# Patient Record
Sex: Female | Born: 1965 | Race: White | Hispanic: No | Marital: Married
Health system: Southern US, Community
[De-identification: ages and names within clinical notes are randomized; demographics above are authoritative.]

---

## 1997-11-15 ENCOUNTER — Other Ambulatory Visit: Admission: RE | Admit: 1997-11-15 | Discharge: 1997-11-15 | Payer: Self-pay | Admitting: Obstetrics & Gynecology

## 1997-12-28 ENCOUNTER — Ambulatory Visit (HOSPITAL_COMMUNITY): Admission: RE | Admit: 1997-12-28 | Discharge: 1997-12-28 | Payer: Self-pay | Admitting: Obstetrics & Gynecology

## 1997-12-28 ENCOUNTER — Other Ambulatory Visit: Admission: RE | Admit: 1997-12-28 | Discharge: 1997-12-28 | Payer: Self-pay | Admitting: Obstetrics & Gynecology

## 1998-03-15 ENCOUNTER — Other Ambulatory Visit: Admission: RE | Admit: 1998-03-15 | Discharge: 1998-03-15 | Payer: Self-pay | Admitting: Obstetrics & Gynecology

## 1998-06-12 ENCOUNTER — Inpatient Hospital Stay (HOSPITAL_COMMUNITY): Admission: AD | Admit: 1998-06-12 | Discharge: 1998-06-14 | Payer: Self-pay | Admitting: Obstetrics and Gynecology

## 1998-06-17 ENCOUNTER — Inpatient Hospital Stay (HOSPITAL_COMMUNITY): Admission: AD | Admit: 1998-06-17 | Discharge: 1998-06-17 | Payer: Self-pay | Admitting: Obstetrics and Gynecology

## 1998-06-21 ENCOUNTER — Encounter (HOSPITAL_COMMUNITY): Admission: RE | Admit: 1998-06-21 | Discharge: 1998-08-03 | Payer: Self-pay | Admitting: *Deleted

## 1999-10-09 ENCOUNTER — Other Ambulatory Visit: Admission: RE | Admit: 1999-10-09 | Discharge: 1999-10-09 | Payer: Self-pay | Admitting: Obstetrics and Gynecology

## 2000-12-02 ENCOUNTER — Other Ambulatory Visit: Admission: RE | Admit: 2000-12-02 | Discharge: 2000-12-02 | Payer: Self-pay | Admitting: Obstetrics and Gynecology

## 2002-12-06 ENCOUNTER — Other Ambulatory Visit: Admission: RE | Admit: 2002-12-06 | Discharge: 2002-12-06 | Payer: Self-pay | Admitting: Obstetrics and Gynecology

## 2003-12-18 ENCOUNTER — Other Ambulatory Visit: Admission: RE | Admit: 2003-12-18 | Discharge: 2003-12-18 | Payer: Self-pay | Admitting: Obstetrics and Gynecology

## 2005-01-29 ENCOUNTER — Other Ambulatory Visit: Admission: RE | Admit: 2005-01-29 | Discharge: 2005-01-29 | Payer: Self-pay | Admitting: Obstetrics and Gynecology

## 2005-02-12 ENCOUNTER — Ambulatory Visit (HOSPITAL_COMMUNITY): Admission: RE | Admit: 2005-02-12 | Discharge: 2005-02-12 | Payer: Self-pay | Admitting: Obstetrics and Gynecology

## 2005-02-14 ENCOUNTER — Encounter: Admission: RE | Admit: 2005-02-14 | Discharge: 2005-02-14 | Payer: Self-pay | Admitting: Obstetrics and Gynecology

## 2005-08-06 ENCOUNTER — Encounter: Admission: RE | Admit: 2005-08-06 | Discharge: 2005-08-06 | Payer: Self-pay | Admitting: Obstetrics and Gynecology

## 2005-11-10 ENCOUNTER — Other Ambulatory Visit: Admission: RE | Admit: 2005-11-10 | Discharge: 2005-11-10 | Payer: Self-pay | Admitting: Obstetrics and Gynecology

## 2006-01-26 ENCOUNTER — Encounter: Admission: RE | Admit: 2006-01-26 | Discharge: 2006-01-26 | Payer: Self-pay | Admitting: Obstetrics and Gynecology

## 2006-01-26 ENCOUNTER — Encounter (INDEPENDENT_AMBULATORY_CARE_PROVIDER_SITE_OTHER): Payer: Self-pay | Admitting: *Deleted

## 2006-02-20 ENCOUNTER — Ambulatory Visit (HOSPITAL_BASED_OUTPATIENT_CLINIC_OR_DEPARTMENT_OTHER): Admission: RE | Admit: 2006-02-20 | Discharge: 2006-02-20 | Payer: Self-pay | Admitting: General Surgery

## 2006-02-20 ENCOUNTER — Encounter: Admission: RE | Admit: 2006-02-20 | Discharge: 2006-02-20 | Payer: Self-pay | Admitting: General Surgery

## 2006-02-20 ENCOUNTER — Encounter (INDEPENDENT_AMBULATORY_CARE_PROVIDER_SITE_OTHER): Payer: Self-pay | Admitting: Specialist

## 2007-04-01 ENCOUNTER — Encounter: Admission: RE | Admit: 2007-04-01 | Discharge: 2007-04-01 | Payer: Self-pay | Admitting: Obstetrics and Gynecology

## 2007-08-26 HISTORY — PX: BREAST EXCISIONAL BIOPSY: SUR124

## 2008-05-08 ENCOUNTER — Encounter: Admission: RE | Admit: 2008-05-08 | Discharge: 2008-05-08 | Payer: Self-pay | Admitting: Obstetrics and Gynecology

## 2010-09-15 ENCOUNTER — Encounter: Payer: Self-pay | Admitting: Obstetrics and Gynecology

## 2010-09-15 ENCOUNTER — Encounter: Payer: Self-pay | Admitting: General Surgery

## 2011-01-10 NOTE — Op Note (Signed)
NAMEKENNEDIE, PARDOE                    ACCOUNT NO.:  [id number]   MEDICAL RECORD NO.:  [id number]          PATIENT TYPE:  AMB   LOCATION:  DSC                          FACILITY:  [facility]   PHYSICIAN:  Rose Phi. Maple Hudson, M.D.   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   OPERATION:  Excision of  left breast mass with needle localization and  specimen mammogram.   SURGEON:  Rose Phi. Maple Hudson, M.D.   ANESTHESIA:  MAC.   OPERATIVE PROCEDURE:  Prior to coming to the operating room, a localizing  wire had been placed in the medial portion of her left breast to identify  the nonpalpable lump.   The patient was placed on the operating table with the arms extended on the  arm board and the left breast prepped and draped in the usual fashion.  A  short transverse incision was then outlined at the 9 o'clock position of the  left breast using the previously placed wire as a guide.  The area was  thoroughly infiltrated with a local anesthetic mixture.  An incision was  made and with a little traction on the wire, after delivering it into the  incision, we simply excised the wire and surrounding tissue.  Specimen  mammogram confirmed the removal of the lesion.  Hemostasis obtained with the  cautery.  The deeper tissue was approximated with 3-0 Vicryl without  distorting the breast and the subcuticular closure with 4-0 Monocryl and  Steri-Strips carried out.  Dressing applied.  The patient transferred to the  recovery room in satisfactory condition having tolerated the procedure well.      Rose Phi. Maple Hudson, M.D.  Electronically Signed     PRY/MEDQ  D:  02/20/2006  T:  02/20/2006  Job:  [id number]

## 2012-02-24 ENCOUNTER — Other Ambulatory Visit: Payer: Self-pay | Admitting: Obstetrics and Gynecology

## 2012-02-24 DIAGNOSIS — Z1231 Encounter for screening mammogram for malignant neoplasm of breast: Secondary | ICD-10-CM

## 2012-03-01 ENCOUNTER — Ambulatory Visit
Admission: RE | Admit: 2012-03-01 | Discharge: 2012-03-01 | Disposition: A | Discharge status: Home / Self Care | Payer: BC Managed Care – PPO | Source: Ambulatory Visit | Attending: Obstetrics and Gynecology | Admitting: Obstetrics and Gynecology

## 2012-03-01 DIAGNOSIS — Z1231 Encounter for screening mammogram for malignant neoplasm of breast: Secondary | ICD-10-CM

## 2012-03-03 ENCOUNTER — Other Ambulatory Visit: Payer: Self-pay | Admitting: Obstetrics and Gynecology

## 2012-03-03 DIAGNOSIS — R928 Other abnormal and inconclusive findings on diagnostic imaging of breast: Secondary | ICD-10-CM

## 2012-03-05 ENCOUNTER — Other Ambulatory Visit: Payer: BC Managed Care – PPO

## 2012-03-05 ENCOUNTER — Ambulatory Visit
Admission: RE | Admit: 2012-03-05 | Discharge: 2012-03-05 | Disposition: A | Discharge status: Home / Self Care | Payer: BC Managed Care – PPO | Source: Ambulatory Visit | Attending: Obstetrics and Gynecology | Admitting: Obstetrics and Gynecology

## 2012-03-05 DIAGNOSIS — R928 Other abnormal and inconclusive findings on diagnostic imaging of breast: Secondary | ICD-10-CM

## 2012-03-18 ENCOUNTER — Telehealth: Payer: Self-pay

## 2012-03-18 NOTE — Telephone Encounter (Signed)
Pt was called to confirm further F/U following mammogram results.  Pt stated that she did have an ultrasound and findings were dense breast tissue"left". Pt was told to F/U in 6 months. . Me

## 2013-02-02 ENCOUNTER — Other Ambulatory Visit: Payer: Self-pay | Admitting: Obstetrics and Gynecology

## 2013-02-02 DIAGNOSIS — R922 Inconclusive mammogram: Secondary | ICD-10-CM

## 2013-03-09 ENCOUNTER — Ambulatory Visit
Admission: RE | Admit: 2013-03-09 | Discharge: 2013-03-09 | Disposition: A | Discharge status: Home / Self Care | Payer: BC Managed Care – PPO | Source: Ambulatory Visit | Attending: Obstetrics and Gynecology | Admitting: Obstetrics and Gynecology

## 2013-03-09 DIAGNOSIS — R922 Inconclusive mammogram: Secondary | ICD-10-CM

## 2013-03-09 DIAGNOSIS — R9230 Dense breasts, unspecified: Secondary | ICD-10-CM

## 2014-03-03 ENCOUNTER — Other Ambulatory Visit: Payer: Self-pay | Admitting: Obstetrics and Gynecology

## 2014-03-03 DIAGNOSIS — R922 Inconclusive mammogram: Secondary | ICD-10-CM

## 2014-03-10 ENCOUNTER — Ambulatory Visit
Admission: RE | Admit: 2014-03-10 | Discharge: 2014-03-10 | Disposition: A | Discharge status: Home / Self Care | Payer: BC Managed Care – PPO | Source: Ambulatory Visit | Attending: Obstetrics and Gynecology | Admitting: Obstetrics and Gynecology

## 2014-03-10 DIAGNOSIS — R922 Inconclusive mammogram: Secondary | ICD-10-CM

## 2015-12-10 DIAGNOSIS — Z01419 Encounter for gynecological examination (general) (routine) without abnormal findings: Secondary | ICD-10-CM | POA: Diagnosis not present

## 2015-12-10 DIAGNOSIS — N951 Menopausal and female climacteric states: Secondary | ICD-10-CM | POA: Diagnosis not present

## 2015-12-10 DIAGNOSIS — Z1231 Encounter for screening mammogram for malignant neoplasm of breast: Secondary | ICD-10-CM | POA: Diagnosis not present

## 2015-12-10 DIAGNOSIS — Z6826 Body mass index (BMI) 26.0-26.9, adult: Secondary | ICD-10-CM | POA: Diagnosis not present

## 2015-12-10 DIAGNOSIS — Z124 Encounter for screening for malignant neoplasm of cervix: Secondary | ICD-10-CM | POA: Diagnosis not present

## 2015-12-19 DIAGNOSIS — M25512 Pain in left shoulder: Secondary | ICD-10-CM | POA: Diagnosis not present

## 2015-12-25 DIAGNOSIS — M25512 Pain in left shoulder: Secondary | ICD-10-CM | POA: Diagnosis not present

## 2016-02-19 DIAGNOSIS — G4700 Insomnia, unspecified: Secondary | ICD-10-CM | POA: Diagnosis not present

## 2016-02-19 DIAGNOSIS — F419 Anxiety disorder, unspecified: Secondary | ICD-10-CM | POA: Diagnosis not present

## 2016-02-19 DIAGNOSIS — F900 Attention-deficit hyperactivity disorder, predominantly inattentive type: Secondary | ICD-10-CM | POA: Diagnosis not present

## 2016-02-19 DIAGNOSIS — Z0000 Encounter for general adult medical examination without abnormal findings: Secondary | ICD-10-CM | POA: Diagnosis not present

## 2016-03-03 DIAGNOSIS — H5203 Hypermetropia, bilateral: Secondary | ICD-10-CM | POA: Diagnosis not present

## 2016-03-20 DIAGNOSIS — Z0000 Encounter for general adult medical examination without abnormal findings: Secondary | ICD-10-CM | POA: Diagnosis not present

## 2016-03-20 DIAGNOSIS — Z13220 Encounter for screening for lipoid disorders: Secondary | ICD-10-CM | POA: Diagnosis not present

## 2016-03-26 DIAGNOSIS — F419 Anxiety disorder, unspecified: Secondary | ICD-10-CM | POA: Diagnosis not present

## 2016-09-14 DIAGNOSIS — R05 Cough: Secondary | ICD-10-CM | POA: Diagnosis not present

## 2016-09-14 DIAGNOSIS — B349 Viral infection, unspecified: Secondary | ICD-10-CM | POA: Diagnosis not present

## 2016-09-25 DIAGNOSIS — G4700 Insomnia, unspecified: Secondary | ICD-10-CM | POA: Diagnosis not present

## 2016-09-25 DIAGNOSIS — F900 Attention-deficit hyperactivity disorder, predominantly inattentive type: Secondary | ICD-10-CM | POA: Diagnosis not present

## 2016-09-25 DIAGNOSIS — F419 Anxiety disorder, unspecified: Secondary | ICD-10-CM | POA: Diagnosis not present

## 2016-11-08 DIAGNOSIS — J069 Acute upper respiratory infection, unspecified: Secondary | ICD-10-CM | POA: Diagnosis not present

## 2017-02-10 DIAGNOSIS — M25552 Pain in left hip: Secondary | ICD-10-CM | POA: Diagnosis not present

## 2017-02-10 DIAGNOSIS — G8929 Other chronic pain: Secondary | ICD-10-CM | POA: Diagnosis not present

## 2017-02-10 DIAGNOSIS — M1612 Unilateral primary osteoarthritis, left hip: Secondary | ICD-10-CM | POA: Diagnosis not present

## 2017-02-10 DIAGNOSIS — M160 Bilateral primary osteoarthritis of hip: Secondary | ICD-10-CM | POA: Diagnosis not present

## 2017-02-19 DIAGNOSIS — Z0000 Encounter for general adult medical examination without abnormal findings: Secondary | ICD-10-CM | POA: Diagnosis not present

## 2017-02-19 DIAGNOSIS — Z13220 Encounter for screening for lipoid disorders: Secondary | ICD-10-CM | POA: Diagnosis not present

## 2017-02-27 DIAGNOSIS — M25552 Pain in left hip: Secondary | ICD-10-CM | POA: Diagnosis not present

## 2017-03-13 DIAGNOSIS — G8929 Other chronic pain: Secondary | ICD-10-CM | POA: Diagnosis not present

## 2017-03-13 DIAGNOSIS — M25552 Pain in left hip: Secondary | ICD-10-CM | POA: Diagnosis not present

## 2017-03-13 DIAGNOSIS — M545 Low back pain: Secondary | ICD-10-CM | POA: Diagnosis not present

## 2017-03-20 DIAGNOSIS — G8929 Other chronic pain: Secondary | ICD-10-CM | POA: Diagnosis not present

## 2017-03-20 DIAGNOSIS — M545 Low back pain: Secondary | ICD-10-CM | POA: Diagnosis not present

## 2017-03-20 DIAGNOSIS — M25552 Pain in left hip: Secondary | ICD-10-CM | POA: Diagnosis not present

## 2017-03-25 DIAGNOSIS — F900 Attention-deficit hyperactivity disorder, predominantly inattentive type: Secondary | ICD-10-CM | POA: Diagnosis not present

## 2017-03-25 DIAGNOSIS — F419 Anxiety disorder, unspecified: Secondary | ICD-10-CM | POA: Diagnosis not present

## 2017-03-25 DIAGNOSIS — G4700 Insomnia, unspecified: Secondary | ICD-10-CM | POA: Diagnosis not present

## 2017-03-25 DIAGNOSIS — Z1211 Encounter for screening for malignant neoplasm of colon: Secondary | ICD-10-CM | POA: Diagnosis not present

## 2017-03-27 DIAGNOSIS — G8929 Other chronic pain: Secondary | ICD-10-CM | POA: Diagnosis not present

## 2017-03-27 DIAGNOSIS — M545 Low back pain: Secondary | ICD-10-CM | POA: Diagnosis not present

## 2017-03-27 DIAGNOSIS — M25552 Pain in left hip: Secondary | ICD-10-CM | POA: Diagnosis not present

## 2017-04-01 DIAGNOSIS — M25552 Pain in left hip: Secondary | ICD-10-CM | POA: Diagnosis not present

## 2017-04-01 DIAGNOSIS — M47898 Other spondylosis, sacral and sacrococcygeal region: Secondary | ICD-10-CM | POA: Diagnosis not present

## 2017-04-01 DIAGNOSIS — M5416 Radiculopathy, lumbar region: Secondary | ICD-10-CM | POA: Diagnosis not present

## 2017-04-01 DIAGNOSIS — M4317 Spondylolisthesis, lumbosacral region: Secondary | ICD-10-CM | POA: Diagnosis not present

## 2017-04-02 DIAGNOSIS — G8929 Other chronic pain: Secondary | ICD-10-CM | POA: Diagnosis not present

## 2017-04-02 DIAGNOSIS — M545 Low back pain: Secondary | ICD-10-CM | POA: Diagnosis not present

## 2017-04-02 DIAGNOSIS — M25552 Pain in left hip: Secondary | ICD-10-CM | POA: Diagnosis not present

## 2017-04-06 ENCOUNTER — Other Ambulatory Visit: Payer: Self-pay | Admitting: Orthopedic Surgery

## 2017-04-06 DIAGNOSIS — M545 Low back pain, unspecified: Secondary | ICD-10-CM

## 2017-04-06 DIAGNOSIS — M5416 Radiculopathy, lumbar region: Secondary | ICD-10-CM

## 2017-04-13 ENCOUNTER — Ambulatory Visit
Admission: RE | Admit: 2017-04-13 | Discharge: 2017-04-13 | Disposition: A | Discharge status: Home / Self Care | Payer: BLUE CROSS/BLUE SHIELD | Source: Ambulatory Visit | Attending: Orthopedic Surgery | Admitting: Orthopedic Surgery

## 2017-04-13 DIAGNOSIS — M5126 Other intervertebral disc displacement, lumbar region: Secondary | ICD-10-CM | POA: Diagnosis not present

## 2017-04-13 DIAGNOSIS — M545 Low back pain, unspecified: Secondary | ICD-10-CM

## 2017-04-13 DIAGNOSIS — M5416 Radiculopathy, lumbar region: Secondary | ICD-10-CM

## 2017-07-23 DIAGNOSIS — H5203 Hypermetropia, bilateral: Secondary | ICD-10-CM | POA: Diagnosis not present

## 2017-10-01 DIAGNOSIS — F419 Anxiety disorder, unspecified: Secondary | ICD-10-CM | POA: Diagnosis not present

## 2017-10-01 DIAGNOSIS — G4700 Insomnia, unspecified: Secondary | ICD-10-CM | POA: Diagnosis not present

## 2017-10-01 DIAGNOSIS — F900 Attention-deficit hyperactivity disorder, predominantly inattentive type: Secondary | ICD-10-CM | POA: Diagnosis not present

## 2018-02-26 ENCOUNTER — Other Ambulatory Visit: Payer: Self-pay | Admitting: Family Medicine

## 2018-02-26 DIAGNOSIS — Z1231 Encounter for screening mammogram for malignant neoplasm of breast: Secondary | ICD-10-CM

## 2018-03-07 DIAGNOSIS — F419 Anxiety disorder, unspecified: Secondary | ICD-10-CM | POA: Diagnosis not present

## 2018-03-07 DIAGNOSIS — H532 Diplopia: Secondary | ICD-10-CM | POA: Diagnosis not present

## 2018-03-07 DIAGNOSIS — R030 Elevated blood-pressure reading, without diagnosis of hypertension: Secondary | ICD-10-CM | POA: Diagnosis not present

## 2018-03-24 DIAGNOSIS — M7062 Trochanteric bursitis, left hip: Secondary | ICD-10-CM | POA: Diagnosis not present

## 2018-03-24 DIAGNOSIS — M5116 Intervertebral disc disorders with radiculopathy, lumbar region: Secondary | ICD-10-CM | POA: Diagnosis not present

## 2018-03-24 DIAGNOSIS — M1612 Unilateral primary osteoarthritis, left hip: Secondary | ICD-10-CM | POA: Diagnosis not present

## 2018-03-25 ENCOUNTER — Ambulatory Visit
Admission: RE | Admit: 2018-03-25 | Discharge: 2018-03-25 | Disposition: A | Discharge status: Home / Self Care | Payer: BLUE CROSS/BLUE SHIELD | Source: Ambulatory Visit | Attending: Family Medicine | Admitting: Family Medicine

## 2018-03-25 DIAGNOSIS — Z1231 Encounter for screening mammogram for malignant neoplasm of breast: Secondary | ICD-10-CM | POA: Diagnosis not present

## 2018-03-26 ENCOUNTER — Other Ambulatory Visit: Payer: Self-pay | Admitting: Family Medicine

## 2018-03-26 DIAGNOSIS — R928 Other abnormal and inconclusive findings on diagnostic imaging of breast: Secondary | ICD-10-CM

## 2018-03-30 DIAGNOSIS — Z23 Encounter for immunization: Secondary | ICD-10-CM | POA: Diagnosis not present

## 2018-03-30 DIAGNOSIS — Z6826 Body mass index (BMI) 26.0-26.9, adult: Secondary | ICD-10-CM | POA: Diagnosis not present

## 2018-03-30 DIAGNOSIS — Z13220 Encounter for screening for lipoid disorders: Secondary | ICD-10-CM | POA: Diagnosis not present

## 2018-03-30 DIAGNOSIS — F419 Anxiety disorder, unspecified: Secondary | ICD-10-CM | POA: Diagnosis not present

## 2018-03-30 DIAGNOSIS — F900 Attention-deficit hyperactivity disorder, predominantly inattentive type: Secondary | ICD-10-CM | POA: Diagnosis not present

## 2018-03-30 DIAGNOSIS — Z0000 Encounter for general adult medical examination without abnormal findings: Secondary | ICD-10-CM | POA: Diagnosis not present

## 2018-03-30 DIAGNOSIS — G4700 Insomnia, unspecified: Secondary | ICD-10-CM | POA: Diagnosis not present

## 2018-03-31 ENCOUNTER — Ambulatory Visit
Admission: RE | Admit: 2018-03-31 | Discharge: 2018-03-31 | Disposition: A | Discharge status: Home / Self Care | Payer: BLUE CROSS/BLUE SHIELD | Source: Ambulatory Visit | Attending: Family Medicine | Admitting: Family Medicine

## 2018-03-31 ENCOUNTER — Other Ambulatory Visit: Payer: Self-pay | Admitting: Family Medicine

## 2018-03-31 DIAGNOSIS — R928 Other abnormal and inconclusive findings on diagnostic imaging of breast: Secondary | ICD-10-CM

## 2018-03-31 DIAGNOSIS — N6310 Unspecified lump in the right breast, unspecified quadrant: Secondary | ICD-10-CM

## 2018-03-31 DIAGNOSIS — N6489 Other specified disorders of breast: Secondary | ICD-10-CM | POA: Diagnosis not present

## 2018-10-07 ENCOUNTER — Other Ambulatory Visit: Payer: BLUE CROSS/BLUE SHIELD

## 2018-10-07 DIAGNOSIS — Z23 Encounter for immunization: Secondary | ICD-10-CM | POA: Diagnosis not present

## 2018-10-07 DIAGNOSIS — N951 Menopausal and female climacteric states: Secondary | ICD-10-CM | POA: Diagnosis not present

## 2018-10-07 DIAGNOSIS — F900 Attention-deficit hyperactivity disorder, predominantly inattentive type: Secondary | ICD-10-CM | POA: Diagnosis not present

## 2018-10-07 DIAGNOSIS — G4700 Insomnia, unspecified: Secondary | ICD-10-CM | POA: Diagnosis not present

## 2018-10-07 DIAGNOSIS — F419 Anxiety disorder, unspecified: Secondary | ICD-10-CM | POA: Diagnosis not present

## 2018-10-11 DIAGNOSIS — S76012D Strain of muscle, fascia and tendon of left hip, subsequent encounter: Secondary | ICD-10-CM | POA: Diagnosis not present

## 2018-10-11 DIAGNOSIS — M25552 Pain in left hip: Secondary | ICD-10-CM | POA: Diagnosis not present

## 2018-10-23 DIAGNOSIS — S76012D Strain of muscle, fascia and tendon of left hip, subsequent encounter: Secondary | ICD-10-CM | POA: Diagnosis not present

## 2018-10-23 DIAGNOSIS — M25552 Pain in left hip: Secondary | ICD-10-CM | POA: Diagnosis not present

## 2018-10-30 DIAGNOSIS — M25552 Pain in left hip: Secondary | ICD-10-CM | POA: Diagnosis not present

## 2018-10-30 DIAGNOSIS — S76012D Strain of muscle, fascia and tendon of left hip, subsequent encounter: Secondary | ICD-10-CM | POA: Diagnosis not present

## 2018-11-29 IMAGING — MG DIGITAL SCREENING BILATERAL MAMMOGRAM WITH TOMO AND CAD
8 series · 8 of 24 positions shown · non-contrast
Comparison: Previous exam(s).

CLINICAL DATA: Screening.

EXAM:
DIGITAL SCREENING BILATERAL MAMMOGRAM WITH TOMO AND CAD

[R CC synth-2D]
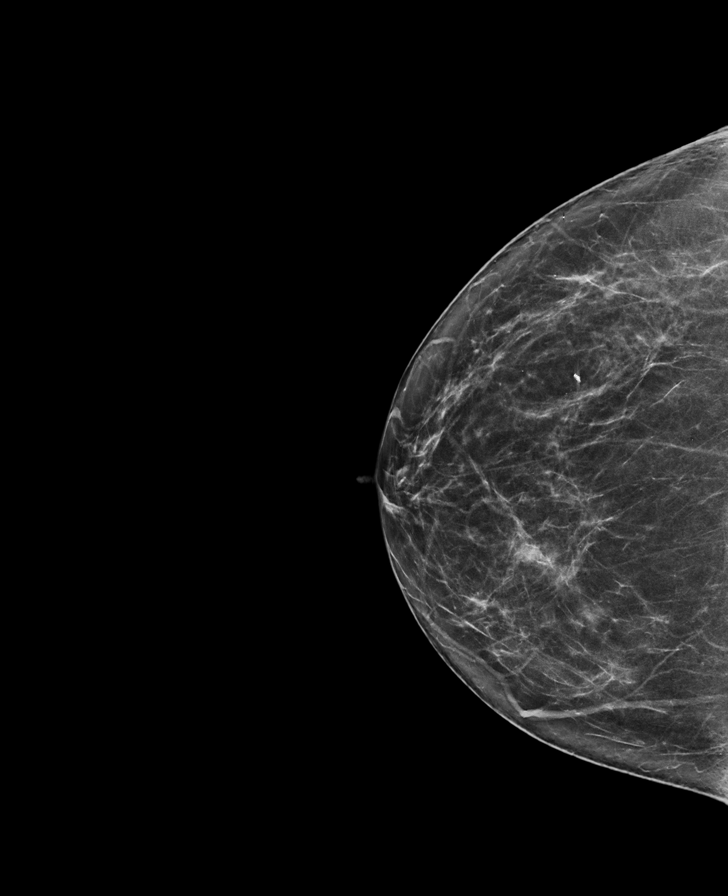

[R MLO synth-2D]
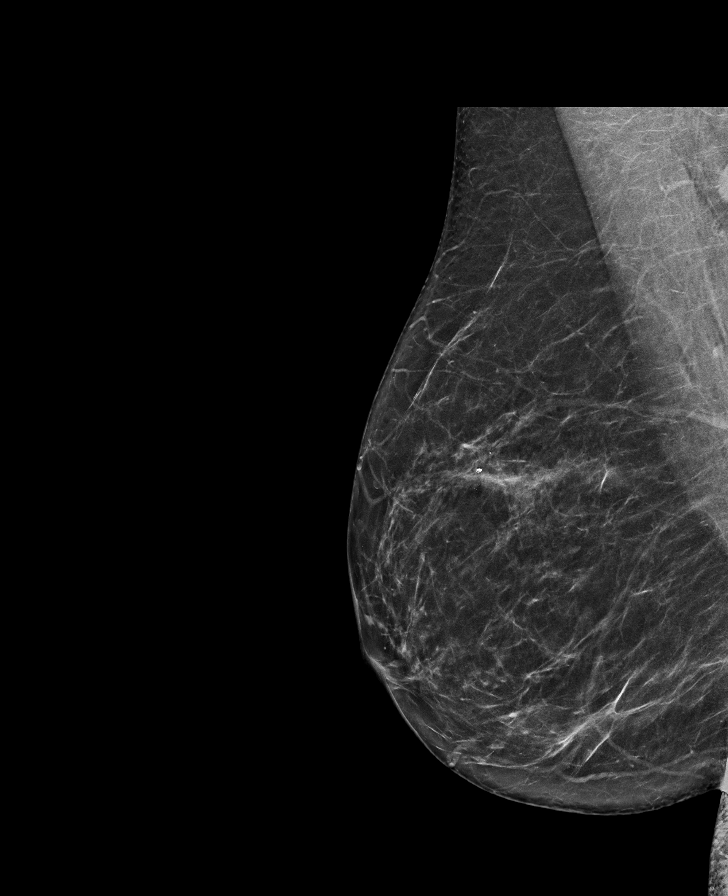

[L MLO synth-2D]
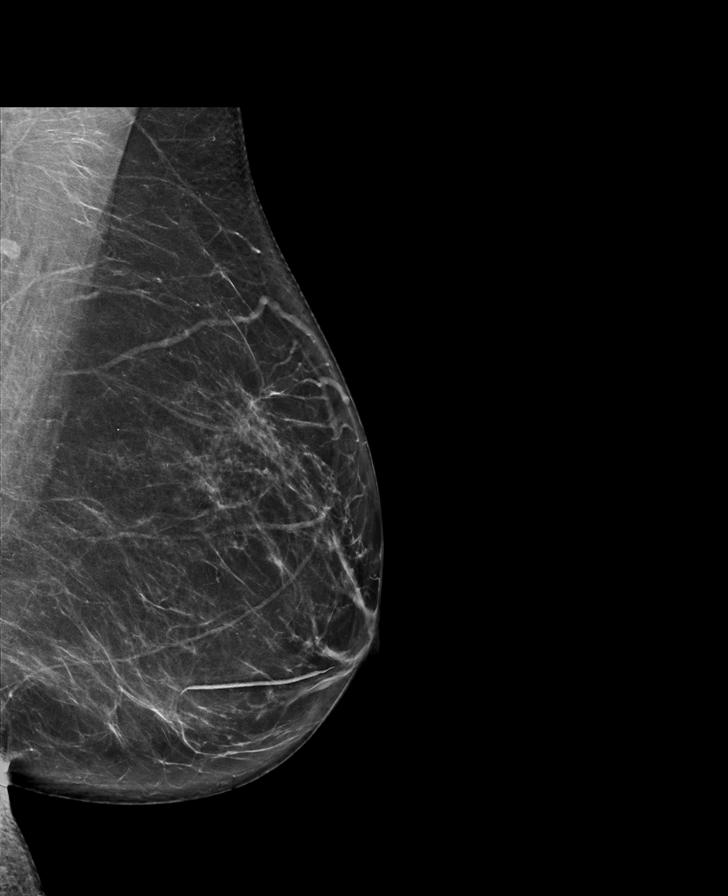

[L CC synth-2D]
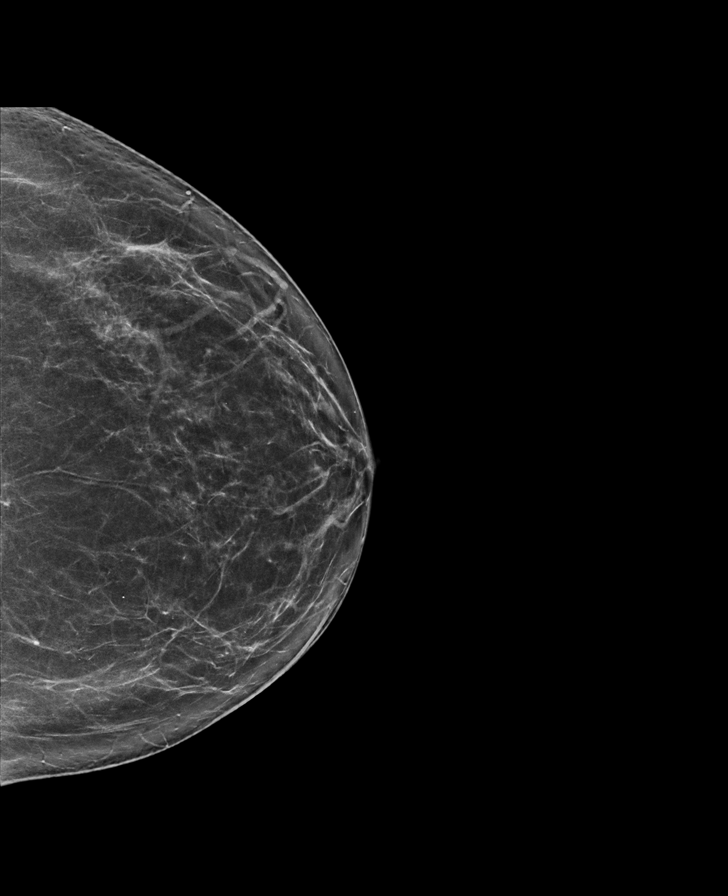

[L MLO tomo · tomo slice 40/79.0]
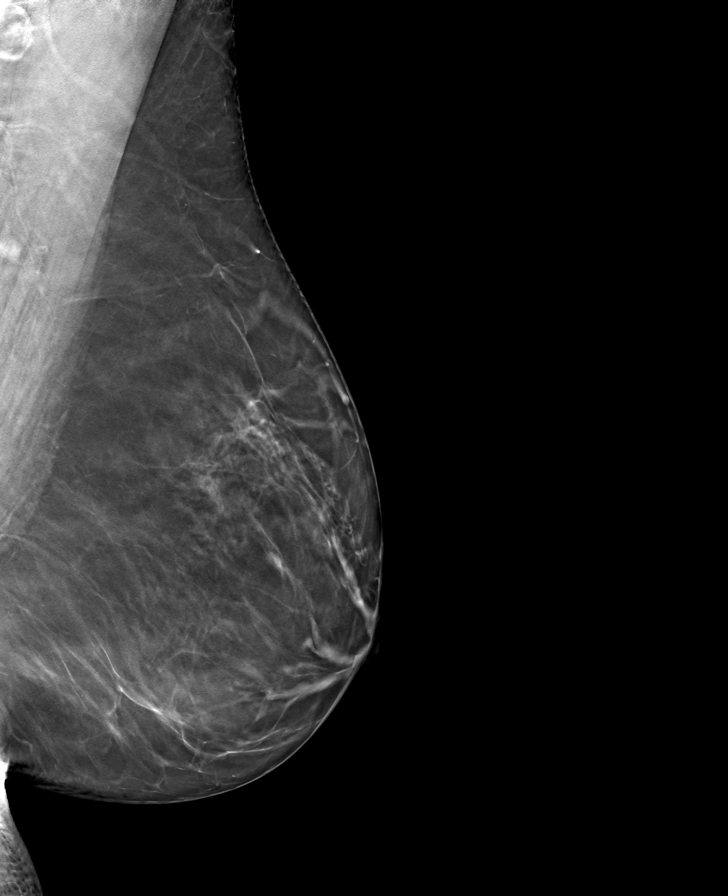

[L CC tomo · tomo slice 37/73.0]
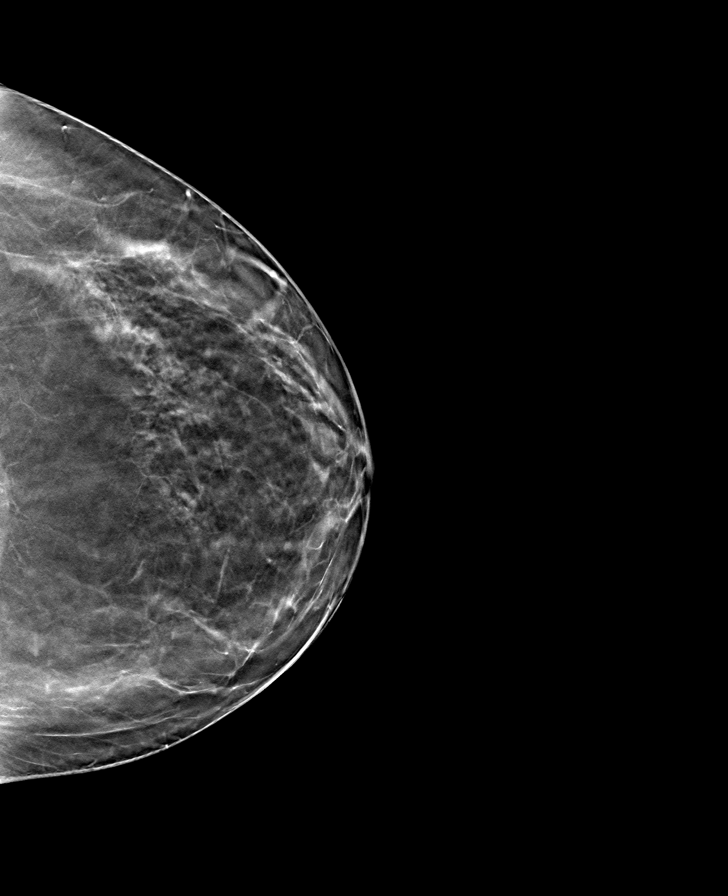

[R MLO tomo · tomo slice 37/72.0]
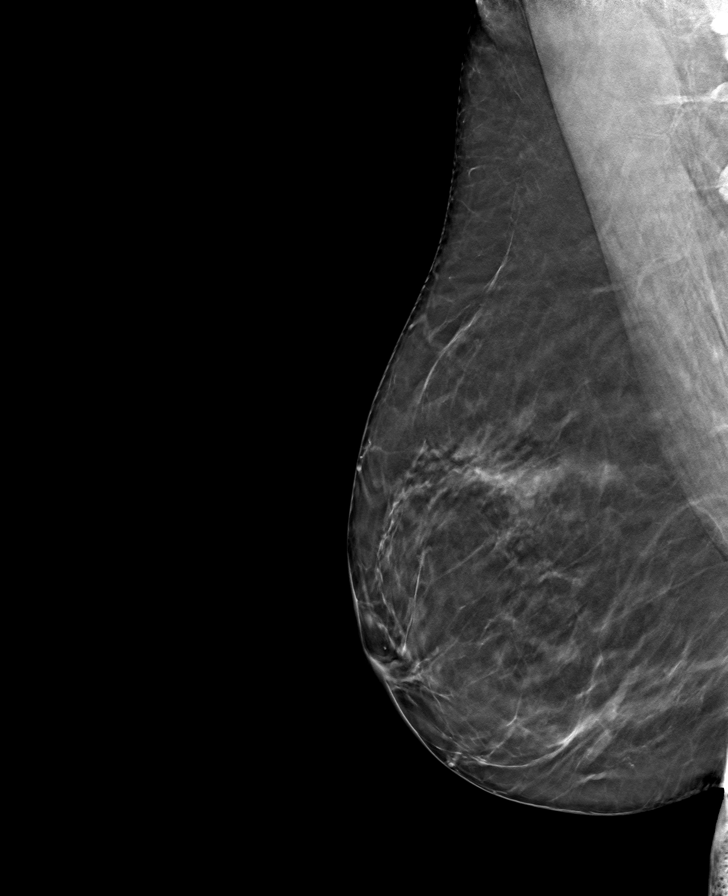

[R CC tomo · tomo slice 37/73.0]
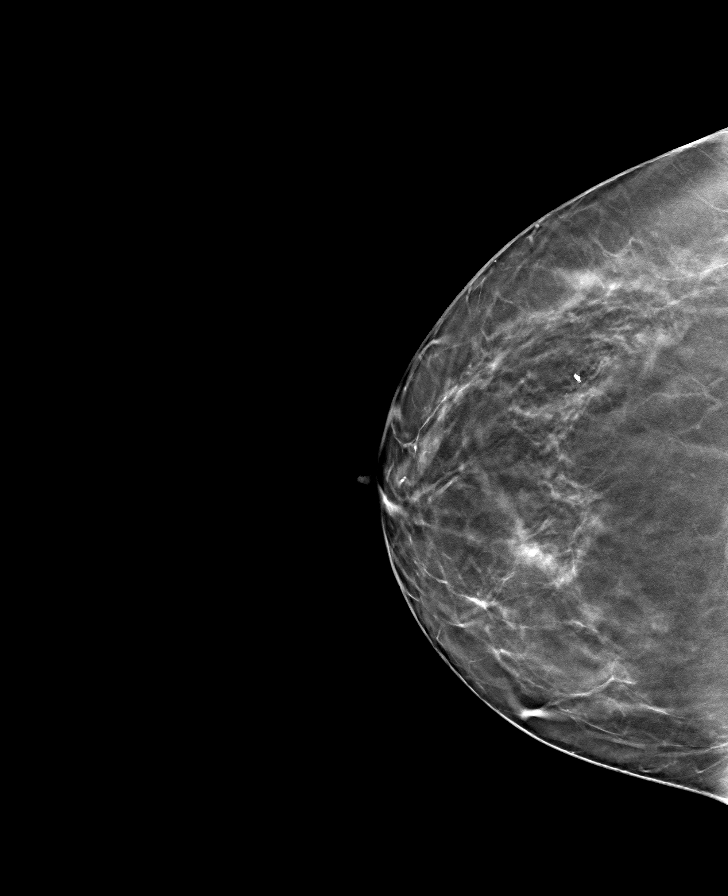

[8 of 24 positions shown; findings below may reference images not displayed]

ACR Breast Density Category b: There are scattered areas of
fibroglandular density.
FINDINGS: In the right breast, a possible asymmetry warrants further
evaluation. This possible asymmetry is seen within the outer RIGHT
breast, at middle depth, tomosynthesis CC slice 25, possible
correlate within the upper RIGHT breast on the MLO view.

In the left breast, no findings suspicious for malignancy. Images
were processed with CAD.
IMPRESSION: Further evaluation is suggested for possible asymmetry in the right
breast.

RECOMMENDATION:
Diagnostic mammogram and possibly ultrasound of the right breast.
(Code:[id number])

The patient will be contacted regarding the findings, and additional
imaging will be scheduled.

BI-RADS CATEGORY  0: Incomplete. Need additional imaging evaluation
and/or prior mammograms for comparison.

## 2018-12-14 DIAGNOSIS — M25552 Pain in left hip: Secondary | ICD-10-CM | POA: Diagnosis not present

## 2018-12-14 DIAGNOSIS — S76012D Strain of muscle, fascia and tendon of left hip, subsequent encounter: Secondary | ICD-10-CM | POA: Diagnosis not present

## 2018-12-21 DIAGNOSIS — M25552 Pain in left hip: Secondary | ICD-10-CM | POA: Diagnosis not present

## 2018-12-21 DIAGNOSIS — S76012D Strain of muscle, fascia and tendon of left hip, subsequent encounter: Secondary | ICD-10-CM | POA: Diagnosis not present

## 2018-12-28 DIAGNOSIS — S76012D Strain of muscle, fascia and tendon of left hip, subsequent encounter: Secondary | ICD-10-CM | POA: Diagnosis not present

## 2018-12-28 DIAGNOSIS — M25552 Pain in left hip: Secondary | ICD-10-CM | POA: Diagnosis not present

## 2019-02-07 DIAGNOSIS — L578 Other skin changes due to chronic exposure to nonionizing radiation: Secondary | ICD-10-CM | POA: Diagnosis not present

## 2019-02-07 DIAGNOSIS — L821 Other seborrheic keratosis: Secondary | ICD-10-CM | POA: Diagnosis not present

## 2019-02-07 DIAGNOSIS — L820 Inflamed seborrheic keratosis: Secondary | ICD-10-CM | POA: Diagnosis not present

## 2019-02-07 DIAGNOSIS — C44619 Basal cell carcinoma of skin of left upper limb, including shoulder: Secondary | ICD-10-CM | POA: Diagnosis not present

## 2019-02-07 DIAGNOSIS — L814 Other melanin hyperpigmentation: Secondary | ICD-10-CM | POA: Diagnosis not present

## 2019-02-14 DIAGNOSIS — Z1159 Encounter for screening for other viral diseases: Secondary | ICD-10-CM | POA: Diagnosis not present

## 2019-03-03 DIAGNOSIS — K602 Anal fissure, unspecified: Secondary | ICD-10-CM | POA: Diagnosis not present

## 2019-03-28 ENCOUNTER — Ambulatory Visit
Admission: RE | Admit: 2019-03-28 | Discharge: 2019-03-28 | Disposition: A | Discharge status: Home / Self Care | Payer: BLUE CROSS/BLUE SHIELD | Source: Ambulatory Visit | Attending: Family Medicine | Admitting: Family Medicine

## 2019-03-28 ENCOUNTER — Ambulatory Visit
Admission: RE | Admit: 2019-03-28 | Discharge: 2019-03-28 | Disposition: A | Discharge status: Home / Self Care | Payer: BC Managed Care – PPO | Source: Ambulatory Visit | Attending: Family Medicine | Admitting: Family Medicine

## 2019-03-28 ENCOUNTER — Other Ambulatory Visit: Payer: Self-pay

## 2019-03-28 ENCOUNTER — Other Ambulatory Visit: Payer: Self-pay | Admitting: Family Medicine

## 2019-03-28 DIAGNOSIS — N6310 Unspecified lump in the right breast, unspecified quadrant: Secondary | ICD-10-CM

## 2019-03-28 DIAGNOSIS — N6312 Unspecified lump in the right breast, upper inner quadrant: Secondary | ICD-10-CM | POA: Diagnosis not present

## 2019-03-28 DIAGNOSIS — N6489 Other specified disorders of breast: Secondary | ICD-10-CM | POA: Diagnosis not present

## 2019-03-28 DIAGNOSIS — R928 Other abnormal and inconclusive findings on diagnostic imaging of breast: Secondary | ICD-10-CM | POA: Diagnosis not present

## 2019-03-28 DIAGNOSIS — N641 Fat necrosis of breast: Secondary | ICD-10-CM | POA: Diagnosis not present

## 2019-03-31 DIAGNOSIS — Z01818 Encounter for other preprocedural examination: Secondary | ICD-10-CM | POA: Diagnosis not present

## 2019-03-31 DIAGNOSIS — K921 Melena: Secondary | ICD-10-CM | POA: Diagnosis not present

## 2019-04-04 ENCOUNTER — Other Ambulatory Visit: Payer: Self-pay | Admitting: Family Medicine

## 2019-04-04 ENCOUNTER — Other Ambulatory Visit (HOSPITAL_COMMUNITY)
Admission: RE | Admit: 2019-04-04 | Discharge: 2019-04-04 | Disposition: A | Discharge status: Home / Self Care | Payer: BC Managed Care – PPO | Source: Ambulatory Visit | Attending: Family Medicine | Admitting: Family Medicine

## 2019-04-04 DIAGNOSIS — G4700 Insomnia, unspecified: Secondary | ICD-10-CM | POA: Diagnosis not present

## 2019-04-04 DIAGNOSIS — F900 Attention-deficit hyperactivity disorder, predominantly inattentive type: Secondary | ICD-10-CM | POA: Diagnosis not present

## 2019-04-04 DIAGNOSIS — Z124 Encounter for screening for malignant neoplasm of cervix: Secondary | ICD-10-CM | POA: Insufficient documentation

## 2019-04-04 DIAGNOSIS — Z13220 Encounter for screening for lipoid disorders: Secondary | ICD-10-CM | POA: Diagnosis not present

## 2019-04-04 DIAGNOSIS — Z23 Encounter for immunization: Secondary | ICD-10-CM | POA: Diagnosis not present

## 2019-04-04 DIAGNOSIS — F419 Anxiety disorder, unspecified: Secondary | ICD-10-CM | POA: Diagnosis not present

## 2019-04-04 DIAGNOSIS — Z0000 Encounter for general adult medical examination without abnormal findings: Secondary | ICD-10-CM | POA: Diagnosis not present

## 2019-04-04 DIAGNOSIS — N951 Menopausal and female climacteric states: Secondary | ICD-10-CM | POA: Diagnosis not present

## 2019-04-06 LAB — CYTOLOGY - PAP
Adequacy: ABSENT
Diagnosis: NEGATIVE

## 2019-05-11 DIAGNOSIS — S82832A Other fracture of upper and lower end of left fibula, initial encounter for closed fracture: Secondary | ICD-10-CM | POA: Diagnosis not present

## 2019-05-12 DIAGNOSIS — S82832D Other fracture of upper and lower end of left fibula, subsequent encounter for closed fracture with routine healing: Secondary | ICD-10-CM | POA: Diagnosis not present

## 2019-05-16 DIAGNOSIS — S82832D Other fracture of upper and lower end of left fibula, subsequent encounter for closed fracture with routine healing: Secondary | ICD-10-CM | POA: Diagnosis not present

## 2019-05-23 DIAGNOSIS — S82832D Other fracture of upper and lower end of left fibula, subsequent encounter for closed fracture with routine healing: Secondary | ICD-10-CM | POA: Diagnosis not present

## 2019-06-14 DIAGNOSIS — Z1159 Encounter for screening for other viral diseases: Secondary | ICD-10-CM | POA: Diagnosis not present

## 2019-06-17 DIAGNOSIS — Z8371 Family history of colonic polyps: Secondary | ICD-10-CM | POA: Diagnosis not present

## 2019-06-17 DIAGNOSIS — D125 Benign neoplasm of sigmoid colon: Secondary | ICD-10-CM | POA: Diagnosis not present

## 2019-06-17 DIAGNOSIS — D122 Benign neoplasm of ascending colon: Secondary | ICD-10-CM | POA: Diagnosis not present

## 2019-06-17 DIAGNOSIS — K5730 Diverticulosis of large intestine without perforation or abscess without bleeding: Secondary | ICD-10-CM | POA: Diagnosis not present

## 2019-06-17 DIAGNOSIS — Z1211 Encounter for screening for malignant neoplasm of colon: Secondary | ICD-10-CM | POA: Diagnosis not present

## 2019-06-17 DIAGNOSIS — K635 Polyp of colon: Secondary | ICD-10-CM | POA: Diagnosis not present

## 2019-07-17 DIAGNOSIS — Z20828 Contact with and (suspected) exposure to other viral communicable diseases: Secondary | ICD-10-CM | POA: Diagnosis not present

## 2019-08-15 DIAGNOSIS — Z20828 Contact with and (suspected) exposure to other viral communicable diseases: Secondary | ICD-10-CM | POA: Diagnosis not present

## 2019-08-31 ENCOUNTER — Ambulatory Visit: Payer: BC Managed Care – PPO | Attending: Internal Medicine

## 2019-08-31 ENCOUNTER — Other Ambulatory Visit: Payer: BC Managed Care – PPO

## 2019-08-31 DIAGNOSIS — Z20828 Contact with and (suspected) exposure to other viral communicable diseases: Secondary | ICD-10-CM | POA: Diagnosis not present

## 2019-08-31 DIAGNOSIS — Z20822 Contact with and (suspected) exposure to covid-19: Secondary | ICD-10-CM

## 2019-09-01 LAB — NOVEL CORONAVIRUS, NAA: SARS-CoV-2, NAA: NOT DETECTED

## 2019-10-06 DIAGNOSIS — G4700 Insomnia, unspecified: Secondary | ICD-10-CM | POA: Diagnosis not present

## 2019-10-06 DIAGNOSIS — N951 Menopausal and female climacteric states: Secondary | ICD-10-CM | POA: Diagnosis not present

## 2019-10-06 DIAGNOSIS — F419 Anxiety disorder, unspecified: Secondary | ICD-10-CM | POA: Diagnosis not present

## 2019-10-06 DIAGNOSIS — F900 Attention-deficit hyperactivity disorder, predominantly inattentive type: Secondary | ICD-10-CM | POA: Diagnosis not present

## 2019-10-22 ENCOUNTER — Ambulatory Visit: Payer: BC Managed Care – PPO | Attending: Internal Medicine

## 2019-10-22 DIAGNOSIS — Z23 Encounter for immunization: Secondary | ICD-10-CM | POA: Insufficient documentation

## 2019-10-22 NOTE — Progress Notes (Signed)
   Covid-19 Vaccination Clinic  Name:  Molly Davies    MRN: [id number] DOB: Mar 26, 1966  10/22/2019  [name] was observed post Covid-19 immunization for 15 minutes without incidence. She was provided with Vaccine Information Sheet and instruction to access the V-Safe system.   [name] was instructed to call 911 with any severe reactions post vaccine: Marland Kitchen Difficulty breathing  . Swelling of your face and throat  . A fast heartbeat  . A bad rash all over your body  . Dizziness and weakness    Immunizations Administered    Name Date Dose VIS Date Route   Pfizer COVID-19 Vaccine 10/22/2019  5:57 PM 0.3 mL 08/05/2019 Intramuscular   Manufacturer: [employer], [employer]   Lot: [id number]   NDC: [address]-0

## 2019-11-12 ENCOUNTER — Ambulatory Visit: Payer: BC Managed Care – PPO | Attending: Internal Medicine

## 2019-11-12 DIAGNOSIS — Z23 Encounter for immunization: Secondary | ICD-10-CM

## 2019-11-12 NOTE — Progress Notes (Signed)
   Covid-19 Vaccination Clinic  Name:  Molly Davies    MRN: [id number] DOB: [birth year]  11/12/2019  [name] was observed post Covid-19 immunization for 15 minutes without incident. She was provided with Vaccine Information Sheet and instruction to access the V-Safe system.   [name] was instructed to call 911 with any severe reactions post vaccine: Marland Kitchen Difficulty breathing  . Swelling of face and throat  . A fast heartbeat  . A bad rash all over body  . Dizziness and weakness   Immunizations Administered    Name Date Dose VIS Date Route   Pfizer COVID-19 Vaccine 11/12/2019  9:33 AM 0.3 mL 08/05/2019 Intramuscular   Manufacturer: [employer], [employer]   Lot: [id number]   NDC: [address]-7

## 2020-01-31 DIAGNOSIS — L918 Other hypertrophic disorders of the skin: Secondary | ICD-10-CM | POA: Diagnosis not present

## 2020-01-31 DIAGNOSIS — L821 Other seborrheic keratosis: Secondary | ICD-10-CM | POA: Diagnosis not present

## 2020-01-31 DIAGNOSIS — D1801 Hemangioma of skin and subcutaneous tissue: Secondary | ICD-10-CM | POA: Diagnosis not present

## 2020-01-31 DIAGNOSIS — L209 Atopic dermatitis, unspecified: Secondary | ICD-10-CM | POA: Diagnosis not present

## 2020-02-07 DIAGNOSIS — H5203 Hypermetropia, bilateral: Secondary | ICD-10-CM | POA: Diagnosis not present

## 2020-02-22 DIAGNOSIS — S76012D Strain of muscle, fascia and tendon of left hip, subsequent encounter: Secondary | ICD-10-CM | POA: Diagnosis not present

## 2020-02-22 DIAGNOSIS — M25552 Pain in left hip: Secondary | ICD-10-CM | POA: Diagnosis not present

## 2020-02-23 ENCOUNTER — Other Ambulatory Visit: Payer: Self-pay | Admitting: Family Medicine

## 2020-02-23 DIAGNOSIS — Z1231 Encounter for screening mammogram for malignant neoplasm of breast: Secondary | ICD-10-CM

## 2020-02-27 DIAGNOSIS — M25552 Pain in left hip: Secondary | ICD-10-CM | POA: Diagnosis not present

## 2020-02-27 DIAGNOSIS — S76012D Strain of muscle, fascia and tendon of left hip, subsequent encounter: Secondary | ICD-10-CM | POA: Diagnosis not present

## 2020-04-06 ENCOUNTER — Ambulatory Visit
Admission: RE | Admit: 2020-04-06 | Discharge: 2020-04-06 | Disposition: A | Discharge status: Home / Self Care | Payer: BC Managed Care – PPO | Source: Ambulatory Visit | Attending: Family Medicine | Admitting: Family Medicine

## 2020-04-06 DIAGNOSIS — Z1231 Encounter for screening mammogram for malignant neoplasm of breast: Secondary | ICD-10-CM

## 2020-04-10 DIAGNOSIS — Z0000 Encounter for general adult medical examination without abnormal findings: Secondary | ICD-10-CM | POA: Diagnosis not present

## 2020-04-10 DIAGNOSIS — F900 Attention-deficit hyperactivity disorder, predominantly inattentive type: Secondary | ICD-10-CM | POA: Diagnosis not present

## 2020-04-10 DIAGNOSIS — Z13220 Encounter for screening for lipoid disorders: Secondary | ICD-10-CM | POA: Diagnosis not present

## 2020-04-10 DIAGNOSIS — N951 Menopausal and female climacteric states: Secondary | ICD-10-CM | POA: Diagnosis not present

## 2020-04-10 DIAGNOSIS — F419 Anxiety disorder, unspecified: Secondary | ICD-10-CM | POA: Diagnosis not present

## 2020-04-10 DIAGNOSIS — G4700 Insomnia, unspecified: Secondary | ICD-10-CM | POA: Diagnosis not present

## 2020-12-27 DIAGNOSIS — F900 Attention-deficit hyperactivity disorder, predominantly inattentive type: Secondary | ICD-10-CM | POA: Diagnosis not present

## 2020-12-27 DIAGNOSIS — G4700 Insomnia, unspecified: Secondary | ICD-10-CM | POA: Diagnosis not present

## 2020-12-27 DIAGNOSIS — L290 Pruritus ani: Secondary | ICD-10-CM | POA: Diagnosis not present

## 2020-12-27 DIAGNOSIS — F419 Anxiety disorder, unspecified: Secondary | ICD-10-CM | POA: Diagnosis not present

## 2021-01-30 DIAGNOSIS — U071 COVID-19: Secondary | ICD-10-CM | POA: Diagnosis not present

## 2021-01-30 DIAGNOSIS — R058 Other specified cough: Secondary | ICD-10-CM | POA: Diagnosis not present

## 2021-02-18 DIAGNOSIS — L578 Other skin changes due to chronic exposure to nonionizing radiation: Secondary | ICD-10-CM | POA: Diagnosis not present

## 2021-02-18 DIAGNOSIS — L821 Other seborrheic keratosis: Secondary | ICD-10-CM | POA: Diagnosis not present

## 2021-02-18 DIAGNOSIS — L814 Other melanin hyperpigmentation: Secondary | ICD-10-CM | POA: Diagnosis not present

## 2021-03-26 DIAGNOSIS — M722 Plantar fascial fibromatosis: Secondary | ICD-10-CM | POA: Diagnosis not present

## 2021-03-26 DIAGNOSIS — S83012D Lateral subluxation of left patella, subsequent encounter: Secondary | ICD-10-CM | POA: Diagnosis not present

## 2021-03-26 DIAGNOSIS — S86011D Strain of right Achilles tendon, subsequent encounter: Secondary | ICD-10-CM | POA: Diagnosis not present

## 2021-03-28 DIAGNOSIS — S86012D Strain of left Achilles tendon, subsequent encounter: Secondary | ICD-10-CM | POA: Diagnosis not present

## 2021-03-28 DIAGNOSIS — S86011D Strain of right Achilles tendon, subsequent encounter: Secondary | ICD-10-CM | POA: Diagnosis not present

## 2021-03-28 DIAGNOSIS — M722 Plantar fascial fibromatosis: Secondary | ICD-10-CM | POA: Diagnosis not present

## 2021-04-02 DIAGNOSIS — S86011D Strain of right Achilles tendon, subsequent encounter: Secondary | ICD-10-CM | POA: Diagnosis not present

## 2021-04-02 DIAGNOSIS — S86012D Strain of left Achilles tendon, subsequent encounter: Secondary | ICD-10-CM | POA: Diagnosis not present

## 2021-04-02 DIAGNOSIS — M722 Plantar fascial fibromatosis: Secondary | ICD-10-CM | POA: Diagnosis not present

## 2021-04-04 DIAGNOSIS — S86011D Strain of right Achilles tendon, subsequent encounter: Secondary | ICD-10-CM | POA: Diagnosis not present

## 2021-04-04 DIAGNOSIS — M722 Plantar fascial fibromatosis: Secondary | ICD-10-CM | POA: Diagnosis not present

## 2021-04-04 DIAGNOSIS — S86012D Strain of left Achilles tendon, subsequent encounter: Secondary | ICD-10-CM | POA: Diagnosis not present

## 2021-04-08 DIAGNOSIS — M722 Plantar fascial fibromatosis: Secondary | ICD-10-CM | POA: Diagnosis not present

## 2021-04-08 DIAGNOSIS — S86011D Strain of right Achilles tendon, subsequent encounter: Secondary | ICD-10-CM | POA: Diagnosis not present

## 2021-04-08 DIAGNOSIS — S86012D Strain of left Achilles tendon, subsequent encounter: Secondary | ICD-10-CM | POA: Diagnosis not present

## 2021-04-23 DIAGNOSIS — S86012D Strain of left Achilles tendon, subsequent encounter: Secondary | ICD-10-CM | POA: Diagnosis not present

## 2021-04-23 DIAGNOSIS — S86011D Strain of right Achilles tendon, subsequent encounter: Secondary | ICD-10-CM | POA: Diagnosis not present

## 2021-04-23 DIAGNOSIS — M722 Plantar fascial fibromatosis: Secondary | ICD-10-CM | POA: Diagnosis not present

## 2021-04-24 DIAGNOSIS — R5383 Other fatigue: Secondary | ICD-10-CM | POA: Diagnosis not present

## 2021-04-24 DIAGNOSIS — L290 Pruritus ani: Secondary | ICD-10-CM | POA: Diagnosis not present

## 2021-04-24 DIAGNOSIS — F419 Anxiety disorder, unspecified: Secondary | ICD-10-CM | POA: Diagnosis not present

## 2021-04-24 DIAGNOSIS — F900 Attention-deficit hyperactivity disorder, predominantly inattentive type: Secondary | ICD-10-CM | POA: Diagnosis not present

## 2021-04-24 DIAGNOSIS — G4700 Insomnia, unspecified: Secondary | ICD-10-CM | POA: Diagnosis not present

## 2021-04-24 DIAGNOSIS — Z0000 Encounter for general adult medical examination without abnormal findings: Secondary | ICD-10-CM | POA: Diagnosis not present

## 2021-04-24 DIAGNOSIS — Z13220 Encounter for screening for lipoid disorders: Secondary | ICD-10-CM | POA: Diagnosis not present

## 2021-04-25 DIAGNOSIS — M722 Plantar fascial fibromatosis: Secondary | ICD-10-CM | POA: Diagnosis not present

## 2021-04-25 DIAGNOSIS — S86012D Strain of left Achilles tendon, subsequent encounter: Secondary | ICD-10-CM | POA: Diagnosis not present

## 2021-04-25 DIAGNOSIS — S86011D Strain of right Achilles tendon, subsequent encounter: Secondary | ICD-10-CM | POA: Diagnosis not present

## 2021-04-30 DIAGNOSIS — S46011D Strain of muscle(s) and tendon(s) of the rotator cuff of right shoulder, subsequent encounter: Secondary | ICD-10-CM | POA: Diagnosis not present

## 2021-04-30 DIAGNOSIS — M722 Plantar fascial fibromatosis: Secondary | ICD-10-CM | POA: Diagnosis not present

## 2021-04-30 DIAGNOSIS — S46012D Strain of muscle(s) and tendon(s) of the rotator cuff of left shoulder, subsequent encounter: Secondary | ICD-10-CM | POA: Diagnosis not present

## 2021-05-07 DIAGNOSIS — S86012D Strain of left Achilles tendon, subsequent encounter: Secondary | ICD-10-CM | POA: Diagnosis not present

## 2021-05-07 DIAGNOSIS — M722 Plantar fascial fibromatosis: Secondary | ICD-10-CM | POA: Diagnosis not present

## 2021-05-07 DIAGNOSIS — S86001D Unspecified injury of right Achilles tendon, subsequent encounter: Secondary | ICD-10-CM | POA: Diagnosis not present

## 2021-05-21 DIAGNOSIS — S86012D Strain of left Achilles tendon, subsequent encounter: Secondary | ICD-10-CM | POA: Diagnosis not present

## 2021-05-21 DIAGNOSIS — E7800 Pure hypercholesterolemia, unspecified: Secondary | ICD-10-CM | POA: Diagnosis not present

## 2021-05-21 DIAGNOSIS — Z1331 Encounter for screening for depression: Secondary | ICD-10-CM | POA: Diagnosis not present

## 2021-05-21 DIAGNOSIS — Z23 Encounter for immunization: Secondary | ICD-10-CM | POA: Diagnosis not present

## 2021-05-21 DIAGNOSIS — M722 Plantar fascial fibromatosis: Secondary | ICD-10-CM | POA: Diagnosis not present

## 2021-05-21 DIAGNOSIS — Z1339 Encounter for screening examination for other mental health and behavioral disorders: Secondary | ICD-10-CM | POA: Diagnosis not present

## 2021-05-21 DIAGNOSIS — S86011D Strain of right Achilles tendon, subsequent encounter: Secondary | ICD-10-CM | POA: Diagnosis not present

## 2021-05-22 ENCOUNTER — Other Ambulatory Visit: Payer: Self-pay | Admitting: Internal Medicine

## 2021-05-22 DIAGNOSIS — E7800 Pure hypercholesterolemia, unspecified: Secondary | ICD-10-CM

## 2021-05-23 DIAGNOSIS — S86011D Strain of right Achilles tendon, subsequent encounter: Secondary | ICD-10-CM | POA: Diagnosis not present

## 2021-05-23 DIAGNOSIS — M722 Plantar fascial fibromatosis: Secondary | ICD-10-CM | POA: Diagnosis not present

## 2021-05-23 DIAGNOSIS — S86012D Strain of left Achilles tendon, subsequent encounter: Secondary | ICD-10-CM | POA: Diagnosis not present

## 2021-05-27 DIAGNOSIS — M722 Plantar fascial fibromatosis: Secondary | ICD-10-CM | POA: Diagnosis not present

## 2021-05-27 DIAGNOSIS — S86011D Strain of right Achilles tendon, subsequent encounter: Secondary | ICD-10-CM | POA: Diagnosis not present

## 2021-05-27 DIAGNOSIS — S86012D Strain of left Achilles tendon, subsequent encounter: Secondary | ICD-10-CM | POA: Diagnosis not present

## 2021-05-30 DIAGNOSIS — S86012D Strain of left Achilles tendon, subsequent encounter: Secondary | ICD-10-CM | POA: Diagnosis not present

## 2021-05-30 DIAGNOSIS — S86011D Strain of right Achilles tendon, subsequent encounter: Secondary | ICD-10-CM | POA: Diagnosis not present

## 2021-05-30 DIAGNOSIS — M722 Plantar fascial fibromatosis: Secondary | ICD-10-CM | POA: Diagnosis not present

## 2021-06-04 DIAGNOSIS — S86012D Strain of left Achilles tendon, subsequent encounter: Secondary | ICD-10-CM | POA: Diagnosis not present

## 2021-06-04 DIAGNOSIS — S86011D Strain of right Achilles tendon, subsequent encounter: Secondary | ICD-10-CM | POA: Diagnosis not present

## 2021-06-04 DIAGNOSIS — M722 Plantar fascial fibromatosis: Secondary | ICD-10-CM | POA: Diagnosis not present

## 2021-06-06 DIAGNOSIS — S86011D Strain of right Achilles tendon, subsequent encounter: Secondary | ICD-10-CM | POA: Diagnosis not present

## 2021-06-06 DIAGNOSIS — M722 Plantar fascial fibromatosis: Secondary | ICD-10-CM | POA: Diagnosis not present

## 2021-06-06 DIAGNOSIS — S86012D Strain of left Achilles tendon, subsequent encounter: Secondary | ICD-10-CM | POA: Diagnosis not present

## 2021-06-07 ENCOUNTER — Other Ambulatory Visit: Payer: Self-pay

## 2021-06-07 ENCOUNTER — Ambulatory Visit
Admission: RE | Admit: 2021-06-07 | Discharge: 2021-06-07 | Disposition: A | Discharge status: Home / Self Care | Payer: No Typology Code available for payment source | Source: Ambulatory Visit | Attending: Internal Medicine | Admitting: Internal Medicine

## 2021-06-07 DIAGNOSIS — E7800 Pure hypercholesterolemia, unspecified: Secondary | ICD-10-CM

## 2021-06-11 DIAGNOSIS — M722 Plantar fascial fibromatosis: Secondary | ICD-10-CM | POA: Diagnosis not present

## 2021-06-11 DIAGNOSIS — S86011D Strain of right Achilles tendon, subsequent encounter: Secondary | ICD-10-CM | POA: Diagnosis not present

## 2021-06-11 DIAGNOSIS — S86012D Strain of left Achilles tendon, subsequent encounter: Secondary | ICD-10-CM | POA: Diagnosis not present

## 2021-06-12 DIAGNOSIS — H5203 Hypermetropia, bilateral: Secondary | ICD-10-CM | POA: Diagnosis not present

## 2021-06-13 DIAGNOSIS — M722 Plantar fascial fibromatosis: Secondary | ICD-10-CM | POA: Diagnosis not present

## 2021-06-13 DIAGNOSIS — S86011D Strain of right Achilles tendon, subsequent encounter: Secondary | ICD-10-CM | POA: Diagnosis not present

## 2021-06-13 DIAGNOSIS — S86012D Strain of left Achilles tendon, subsequent encounter: Secondary | ICD-10-CM | POA: Diagnosis not present

## 2021-06-18 DIAGNOSIS — S86012D Strain of left Achilles tendon, subsequent encounter: Secondary | ICD-10-CM | POA: Diagnosis not present

## 2021-06-18 DIAGNOSIS — S86011D Strain of right Achilles tendon, subsequent encounter: Secondary | ICD-10-CM | POA: Diagnosis not present

## 2021-06-18 DIAGNOSIS — M722 Plantar fascial fibromatosis: Secondary | ICD-10-CM | POA: Diagnosis not present

## 2021-06-20 DIAGNOSIS — S86012D Strain of left Achilles tendon, subsequent encounter: Secondary | ICD-10-CM | POA: Diagnosis not present

## 2021-06-20 DIAGNOSIS — M722 Plantar fascial fibromatosis: Secondary | ICD-10-CM | POA: Diagnosis not present

## 2021-06-20 DIAGNOSIS — S86011D Strain of right Achilles tendon, subsequent encounter: Secondary | ICD-10-CM | POA: Diagnosis not present

## 2021-06-25 DIAGNOSIS — S86011D Strain of right Achilles tendon, subsequent encounter: Secondary | ICD-10-CM | POA: Diagnosis not present

## 2021-06-25 DIAGNOSIS — M722 Plantar fascial fibromatosis: Secondary | ICD-10-CM | POA: Diagnosis not present

## 2021-06-25 DIAGNOSIS — S86012D Strain of left Achilles tendon, subsequent encounter: Secondary | ICD-10-CM | POA: Diagnosis not present

## 2021-06-26 DIAGNOSIS — S86011D Strain of right Achilles tendon, subsequent encounter: Secondary | ICD-10-CM | POA: Diagnosis not present

## 2021-06-26 DIAGNOSIS — S86012D Strain of left Achilles tendon, subsequent encounter: Secondary | ICD-10-CM | POA: Diagnosis not present

## 2021-06-26 DIAGNOSIS — M722 Plantar fascial fibromatosis: Secondary | ICD-10-CM | POA: Diagnosis not present

## 2021-07-04 DIAGNOSIS — S86012D Strain of left Achilles tendon, subsequent encounter: Secondary | ICD-10-CM | POA: Diagnosis not present

## 2021-07-04 DIAGNOSIS — M722 Plantar fascial fibromatosis: Secondary | ICD-10-CM | POA: Diagnosis not present

## 2021-07-04 DIAGNOSIS — S86011D Strain of right Achilles tendon, subsequent encounter: Secondary | ICD-10-CM | POA: Diagnosis not present

## 2021-07-09 DIAGNOSIS — M722 Plantar fascial fibromatosis: Secondary | ICD-10-CM | POA: Diagnosis not present

## 2021-07-09 DIAGNOSIS — S86011D Strain of right Achilles tendon, subsequent encounter: Secondary | ICD-10-CM | POA: Diagnosis not present

## 2021-07-09 DIAGNOSIS — S86012D Strain of left Achilles tendon, subsequent encounter: Secondary | ICD-10-CM | POA: Diagnosis not present

## 2021-07-23 DIAGNOSIS — S86011D Strain of right Achilles tendon, subsequent encounter: Secondary | ICD-10-CM | POA: Diagnosis not present

## 2021-07-23 DIAGNOSIS — S86012D Strain of left Achilles tendon, subsequent encounter: Secondary | ICD-10-CM | POA: Diagnosis not present

## 2021-07-23 DIAGNOSIS — M722 Plantar fascial fibromatosis: Secondary | ICD-10-CM | POA: Diagnosis not present

## 2021-09-12 DIAGNOSIS — F411 Generalized anxiety disorder: Secondary | ICD-10-CM | POA: Diagnosis not present

## 2021-09-18 DIAGNOSIS — R051 Acute cough: Secondary | ICD-10-CM | POA: Diagnosis not present

## 2021-09-18 DIAGNOSIS — R0981 Nasal congestion: Secondary | ICD-10-CM | POA: Diagnosis not present

## 2021-09-18 DIAGNOSIS — J069 Acute upper respiratory infection, unspecified: Secondary | ICD-10-CM | POA: Diagnosis not present

## 2021-09-25 DIAGNOSIS — F411 Generalized anxiety disorder: Secondary | ICD-10-CM | POA: Diagnosis not present

## 2021-10-23 DIAGNOSIS — F411 Generalized anxiety disorder: Secondary | ICD-10-CM | POA: Diagnosis not present

## 2021-11-13 DIAGNOSIS — F411 Generalized anxiety disorder: Secondary | ICD-10-CM | POA: Diagnosis not present

## 2021-12-25 DIAGNOSIS — F411 Generalized anxiety disorder: Secondary | ICD-10-CM | POA: Diagnosis not present

## 2021-12-26 DIAGNOSIS — M47812 Spondylosis without myelopathy or radiculopathy, cervical region: Secondary | ICD-10-CM | POA: Diagnosis not present

## 2022-01-09 DIAGNOSIS — M47812 Spondylosis without myelopathy or radiculopathy, cervical region: Secondary | ICD-10-CM | POA: Diagnosis not present

## 2022-01-30 DIAGNOSIS — L02415 Cutaneous abscess of right lower limb: Secondary | ICD-10-CM | POA: Diagnosis not present

## 2022-02-12 DIAGNOSIS — R051 Acute cough: Secondary | ICD-10-CM | POA: Diagnosis not present

## 2022-02-12 DIAGNOSIS — R062 Wheezing: Secondary | ICD-10-CM | POA: Diagnosis not present

## 2022-03-26 DIAGNOSIS — F411 Generalized anxiety disorder: Secondary | ICD-10-CM | POA: Diagnosis not present

## 2022-04-04 ENCOUNTER — Other Ambulatory Visit: Payer: Self-pay | Admitting: Internal Medicine

## 2022-04-04 DIAGNOSIS — Z1231 Encounter for screening mammogram for malignant neoplasm of breast: Secondary | ICD-10-CM

## 2022-04-09 DIAGNOSIS — F411 Generalized anxiety disorder: Secondary | ICD-10-CM | POA: Diagnosis not present

## 2022-04-10 ENCOUNTER — Other Ambulatory Visit (HOSPITAL_COMMUNITY): Payer: Self-pay

## 2022-04-10 MED ORDER — VYVANSE 50 MG PO CAPS
50.0000 mg | ORAL_CAPSULE | Freq: Every morning | ORAL | 0 refills | Status: AC
  Filled 2022-04-10: qty 30, 30d supply, fill #0

## 2022-04-11 ENCOUNTER — Ambulatory Visit
Admission: RE | Admit: 2022-04-11 | Discharge: 2022-04-11 | Disposition: A | Discharge status: Home / Self Care | Payer: BC Managed Care – PPO | Source: Ambulatory Visit | Attending: Internal Medicine | Admitting: Internal Medicine

## 2022-04-11 DIAGNOSIS — Z1231 Encounter for screening mammogram for malignant neoplasm of breast: Secondary | ICD-10-CM

## 2022-04-15 ENCOUNTER — Other Ambulatory Visit: Payer: Self-pay | Admitting: Internal Medicine

## 2022-04-15 DIAGNOSIS — R928 Other abnormal and inconclusive findings on diagnostic imaging of breast: Secondary | ICD-10-CM

## 2022-04-22 DIAGNOSIS — F411 Generalized anxiety disorder: Secondary | ICD-10-CM | POA: Diagnosis not present

## 2022-04-25 ENCOUNTER — Ambulatory Visit
Admission: RE | Admit: 2022-04-25 | Discharge: 2022-04-25 | Disposition: A | Discharge status: Home / Self Care | Payer: No Typology Code available for payment source | Source: Ambulatory Visit | Attending: Internal Medicine | Admitting: Internal Medicine

## 2022-04-25 ENCOUNTER — Other Ambulatory Visit: Payer: Self-pay | Admitting: Internal Medicine

## 2022-04-25 ENCOUNTER — Ambulatory Visit
Admission: RE | Admit: 2022-04-25 | Discharge: 2022-04-25 | Disposition: A | Discharge status: Home / Self Care | Payer: BC Managed Care – PPO | Source: Ambulatory Visit | Attending: Internal Medicine | Admitting: Internal Medicine

## 2022-04-25 DIAGNOSIS — N6311 Unspecified lump in the right breast, upper outer quadrant: Secondary | ICD-10-CM | POA: Diagnosis not present

## 2022-04-25 DIAGNOSIS — R928 Other abnormal and inconclusive findings on diagnostic imaging of breast: Secondary | ICD-10-CM

## 2022-04-25 DIAGNOSIS — N6310 Unspecified lump in the right breast, unspecified quadrant: Secondary | ICD-10-CM

## 2022-05-01 ENCOUNTER — Other Ambulatory Visit (HOSPITAL_COMMUNITY): Payer: Self-pay

## 2022-05-01 ENCOUNTER — Ambulatory Visit
Admission: RE | Admit: 2022-05-01 | Discharge: 2022-05-01 | Disposition: A | Discharge status: Home / Self Care | Payer: BC Managed Care – PPO | Source: Ambulatory Visit | Attending: Internal Medicine | Admitting: Internal Medicine

## 2022-05-01 ENCOUNTER — Ambulatory Visit
Admission: RE | Admit: 2022-05-01 | Discharge: 2022-05-01 | Disposition: A | Discharge status: Home / Self Care | Payer: No Typology Code available for payment source | Source: Ambulatory Visit | Attending: Internal Medicine | Admitting: Internal Medicine

## 2022-05-01 DIAGNOSIS — N6011 Diffuse cystic mastopathy of right breast: Secondary | ICD-10-CM | POA: Diagnosis not present

## 2022-05-01 DIAGNOSIS — N6311 Unspecified lump in the right breast, upper outer quadrant: Secondary | ICD-10-CM | POA: Diagnosis not present

## 2022-05-01 DIAGNOSIS — N6310 Unspecified lump in the right breast, unspecified quadrant: Secondary | ICD-10-CM

## 2022-05-01 MED ORDER — LISDEXAMFETAMINE DIMESYLATE 60 MG PO CAPS
60.0000 mg | ORAL_CAPSULE | Freq: Every morning | ORAL | 0 refills | Status: DC
  Filled 2022-05-01: qty 30, 30d supply, fill #0

## 2022-05-26 DIAGNOSIS — F411 Generalized anxiety disorder: Secondary | ICD-10-CM | POA: Diagnosis not present

## 2022-05-30 DIAGNOSIS — E7800 Pure hypercholesterolemia, unspecified: Secondary | ICD-10-CM | POA: Diagnosis not present

## 2022-06-05 ENCOUNTER — Other Ambulatory Visit (HOSPITAL_COMMUNITY): Payer: Self-pay

## 2022-06-05 MED ORDER — LISDEXAMFETAMINE DIMESYLATE 60 MG PO CAPS
60.0000 mg | ORAL_CAPSULE | Freq: Every morning | ORAL | 0 refills | Status: DC
  Filled 2022-06-05: qty 30, 30d supply, fill #0

## 2022-06-06 ENCOUNTER — Other Ambulatory Visit (HOSPITAL_COMMUNITY): Payer: Self-pay

## 2022-06-06 DIAGNOSIS — Z0000 Encounter for general adult medical examination without abnormal findings: Secondary | ICD-10-CM | POA: Diagnosis not present

## 2022-06-06 DIAGNOSIS — F411 Generalized anxiety disorder: Secondary | ICD-10-CM | POA: Diagnosis not present

## 2022-06-06 DIAGNOSIS — Z124 Encounter for screening for malignant neoplasm of cervix: Secondary | ICD-10-CM | POA: Diagnosis not present

## 2022-06-06 DIAGNOSIS — Z23 Encounter for immunization: Secondary | ICD-10-CM | POA: Diagnosis not present

## 2022-06-06 DIAGNOSIS — Z1331 Encounter for screening for depression: Secondary | ICD-10-CM | POA: Diagnosis not present

## 2022-06-06 DIAGNOSIS — Z1339 Encounter for screening examination for other mental health and behavioral disorders: Secondary | ICD-10-CM | POA: Diagnosis not present

## 2022-06-06 DIAGNOSIS — Z1151 Encounter for screening for human papillomavirus (HPV): Secondary | ICD-10-CM | POA: Diagnosis not present

## 2022-06-11 DIAGNOSIS — F411 Generalized anxiety disorder: Secondary | ICD-10-CM | POA: Diagnosis not present

## 2022-06-19 DIAGNOSIS — H903 Sensorineural hearing loss, bilateral: Secondary | ICD-10-CM | POA: Diagnosis not present

## 2022-07-04 ENCOUNTER — Other Ambulatory Visit (HOSPITAL_COMMUNITY): Payer: Self-pay

## 2022-07-04 MED ORDER — LISDEXAMFETAMINE DIMESYLATE 60 MG PO CAPS
60.0000 mg | ORAL_CAPSULE | Freq: Every morning | ORAL | 0 refills | Status: DC
  Filled 2022-07-04: qty 30, 30d supply, fill #0

## 2022-07-09 DIAGNOSIS — F411 Generalized anxiety disorder: Secondary | ICD-10-CM | POA: Diagnosis not present

## 2022-07-11 DIAGNOSIS — M47812 Spondylosis without myelopathy or radiculopathy, cervical region: Secondary | ICD-10-CM | POA: Diagnosis not present

## 2022-07-11 DIAGNOSIS — H5203 Hypermetropia, bilateral: Secondary | ICD-10-CM | POA: Diagnosis not present

## 2022-07-21 DIAGNOSIS — M47812 Spondylosis without myelopathy or radiculopathy, cervical region: Secondary | ICD-10-CM | POA: Diagnosis not present

## 2022-07-24 DIAGNOSIS — M47812 Spondylosis without myelopathy or radiculopathy, cervical region: Secondary | ICD-10-CM | POA: Diagnosis not present

## 2022-07-29 DIAGNOSIS — M47812 Spondylosis without myelopathy or radiculopathy, cervical region: Secondary | ICD-10-CM | POA: Diagnosis not present

## 2022-07-31 DIAGNOSIS — M47812 Spondylosis without myelopathy or radiculopathy, cervical region: Secondary | ICD-10-CM | POA: Diagnosis not present

## 2022-08-04 DIAGNOSIS — M47812 Spondylosis without myelopathy or radiculopathy, cervical region: Secondary | ICD-10-CM | POA: Diagnosis not present

## 2022-08-05 ENCOUNTER — Other Ambulatory Visit (HOSPITAL_COMMUNITY): Payer: Self-pay

## 2022-08-05 MED ORDER — LISDEXAMFETAMINE DIMESYLATE 60 MG PO CAPS
60.0000 mg | ORAL_CAPSULE | Freq: Every morning | ORAL | 0 refills | Status: DC
  Filled 2022-08-05: qty 30, 30d supply, fill #0

## 2022-08-06 ENCOUNTER — Other Ambulatory Visit (HOSPITAL_COMMUNITY): Payer: Self-pay

## 2022-08-06 DIAGNOSIS — F411 Generalized anxiety disorder: Secondary | ICD-10-CM | POA: Diagnosis not present

## 2022-08-07 DIAGNOSIS — M47812 Spondylosis without myelopathy or radiculopathy, cervical region: Secondary | ICD-10-CM | POA: Diagnosis not present

## 2022-08-13 DIAGNOSIS — M47812 Spondylosis without myelopathy or radiculopathy, cervical region: Secondary | ICD-10-CM | POA: Diagnosis not present

## 2022-09-01 DIAGNOSIS — M47812 Spondylosis without myelopathy or radiculopathy, cervical region: Secondary | ICD-10-CM | POA: Diagnosis not present

## 2022-09-05 ENCOUNTER — Other Ambulatory Visit (HOSPITAL_COMMUNITY): Payer: Self-pay

## 2022-09-05 DIAGNOSIS — M47812 Spondylosis without myelopathy or radiculopathy, cervical region: Secondary | ICD-10-CM | POA: Diagnosis not present

## 2022-09-05 MED ORDER — LISDEXAMFETAMINE DIMESYLATE 60 MG PO CAPS
60.0000 mg | ORAL_CAPSULE | Freq: Every morning | ORAL | 0 refills | Status: DC
  Filled 2022-09-05: qty 30, 30d supply, fill #0

## 2022-09-08 DIAGNOSIS — M47812 Spondylosis without myelopathy or radiculopathy, cervical region: Secondary | ICD-10-CM | POA: Diagnosis not present

## 2022-09-17 DIAGNOSIS — M47812 Spondylosis without myelopathy or radiculopathy, cervical region: Secondary | ICD-10-CM | POA: Diagnosis not present

## 2022-09-22 DIAGNOSIS — M47812 Spondylosis without myelopathy or radiculopathy, cervical region: Secondary | ICD-10-CM | POA: Diagnosis not present

## 2022-09-29 DIAGNOSIS — M47812 Spondylosis without myelopathy or radiculopathy, cervical region: Secondary | ICD-10-CM | POA: Diagnosis not present

## 2022-10-06 ENCOUNTER — Other Ambulatory Visit (HOSPITAL_COMMUNITY): Payer: Self-pay

## 2022-10-06 DIAGNOSIS — M47812 Spondylosis without myelopathy or radiculopathy, cervical region: Secondary | ICD-10-CM | POA: Diagnosis not present

## 2022-10-06 MED ORDER — LISDEXAMFETAMINE DIMESYLATE 60 MG PO CAPS
60.0000 mg | ORAL_CAPSULE | Freq: Every morning | ORAL | 0 refills | Status: DC
  Filled 2022-10-06: qty 30, 30d supply, fill #0

## 2022-10-07 ENCOUNTER — Other Ambulatory Visit (HOSPITAL_COMMUNITY): Payer: Self-pay

## 2022-10-09 DIAGNOSIS — F411 Generalized anxiety disorder: Secondary | ICD-10-CM | POA: Diagnosis not present

## 2022-10-15 DIAGNOSIS — M47812 Spondylosis without myelopathy or radiculopathy, cervical region: Secondary | ICD-10-CM | POA: Diagnosis not present

## 2022-10-27 DIAGNOSIS — M47812 Spondylosis without myelopathy or radiculopathy, cervical region: Secondary | ICD-10-CM | POA: Diagnosis not present

## 2022-10-30 DIAGNOSIS — F411 Generalized anxiety disorder: Secondary | ICD-10-CM | POA: Diagnosis not present

## 2022-11-04 ENCOUNTER — Other Ambulatory Visit (HOSPITAL_COMMUNITY): Payer: Self-pay

## 2022-11-04 MED ORDER — LISDEXAMFETAMINE DIMESYLATE 60 MG PO CAPS
60.0000 mg | ORAL_CAPSULE | Freq: Every morning | ORAL | 0 refills | Status: DC
  Filled 2022-11-04: qty 30, 30d supply, fill #0

## 2022-11-05 DIAGNOSIS — M47812 Spondylosis without myelopathy or radiculopathy, cervical region: Secondary | ICD-10-CM | POA: Diagnosis not present

## 2022-11-13 DIAGNOSIS — F411 Generalized anxiety disorder: Secondary | ICD-10-CM | POA: Diagnosis not present

## 2022-11-20 DIAGNOSIS — R109 Unspecified abdominal pain: Secondary | ICD-10-CM | POA: Diagnosis not present

## 2022-11-20 DIAGNOSIS — J069 Acute upper respiratory infection, unspecified: Secondary | ICD-10-CM | POA: Diagnosis not present

## 2022-11-20 DIAGNOSIS — J029 Acute pharyngitis, unspecified: Secondary | ICD-10-CM | POA: Diagnosis not present

## 2022-11-20 DIAGNOSIS — R0981 Nasal congestion: Secondary | ICD-10-CM | POA: Diagnosis not present

## 2022-11-28 DIAGNOSIS — R1013 Epigastric pain: Secondary | ICD-10-CM | POA: Diagnosis not present

## 2022-11-28 DIAGNOSIS — K219 Gastro-esophageal reflux disease without esophagitis: Secondary | ICD-10-CM | POA: Diagnosis not present

## 2022-12-04 ENCOUNTER — Other Ambulatory Visit (HOSPITAL_COMMUNITY): Payer: Self-pay

## 2022-12-04 MED ORDER — LISDEXAMFETAMINE DIMESYLATE 60 MG PO CAPS
60.0000 mg | ORAL_CAPSULE | Freq: Every morning | ORAL | 0 refills | Status: DC
  Filled 2022-12-04: qty 30, 30d supply, fill #0

## 2022-12-11 DIAGNOSIS — F411 Generalized anxiety disorder: Secondary | ICD-10-CM | POA: Diagnosis not present

## 2022-12-19 DIAGNOSIS — S46811D Strain of other muscles, fascia and tendons at shoulder and upper arm level, right arm, subsequent encounter: Secondary | ICD-10-CM | POA: Diagnosis not present

## 2022-12-22 DIAGNOSIS — S46811D Strain of other muscles, fascia and tendons at shoulder and upper arm level, right arm, subsequent encounter: Secondary | ICD-10-CM | POA: Diagnosis not present

## 2022-12-29 DIAGNOSIS — S46811D Strain of other muscles, fascia and tendons at shoulder and upper arm level, right arm, subsequent encounter: Secondary | ICD-10-CM | POA: Diagnosis not present

## 2023-01-05 ENCOUNTER — Other Ambulatory Visit (HOSPITAL_COMMUNITY): Payer: Self-pay

## 2023-01-05 DIAGNOSIS — S46811D Strain of other muscles, fascia and tendons at shoulder and upper arm level, right arm, subsequent encounter: Secondary | ICD-10-CM | POA: Diagnosis not present

## 2023-01-05 MED ORDER — LISDEXAMFETAMINE DIMESYLATE 60 MG PO CAPS
60.0000 mg | ORAL_CAPSULE | Freq: Every morning | ORAL | 0 refills | Status: DC
  Filled 2023-01-05: qty 30, 30d supply, fill #0

## 2023-01-08 DIAGNOSIS — F411 Generalized anxiety disorder: Secondary | ICD-10-CM | POA: Diagnosis not present

## 2023-01-15 DIAGNOSIS — S46811D Strain of other muscles, fascia and tendons at shoulder and upper arm level, right arm, subsequent encounter: Secondary | ICD-10-CM | POA: Diagnosis not present

## 2023-01-23 DIAGNOSIS — S46811D Strain of other muscles, fascia and tendons at shoulder and upper arm level, right arm, subsequent encounter: Secondary | ICD-10-CM | POA: Diagnosis not present

## 2023-03-09 ENCOUNTER — Other Ambulatory Visit (HOSPITAL_COMMUNITY): Payer: Self-pay

## 2023-03-09 MED ORDER — LISDEXAMFETAMINE DIMESYLATE 60 MG PO CAPS
60.0000 mg | ORAL_CAPSULE | Freq: Every morning | ORAL | 0 refills | Status: DC
  Filled 2023-03-09: qty 30, 30d supply, fill #0

## 2023-03-11 DIAGNOSIS — S46811D Strain of other muscles, fascia and tendons at shoulder and upper arm level, right arm, subsequent encounter: Secondary | ICD-10-CM | POA: Diagnosis not present

## 2023-04-08 ENCOUNTER — Other Ambulatory Visit (HOSPITAL_COMMUNITY): Payer: Self-pay

## 2023-04-08 MED ORDER — LISDEXAMFETAMINE DIMESYLATE 60 MG PO CAPS
60.0000 mg | ORAL_CAPSULE | Freq: Every morning | ORAL | 0 refills | Status: DC
  Filled 2023-04-08: qty 30, 30d supply, fill #0

## 2023-05-05 ENCOUNTER — Other Ambulatory Visit (HOSPITAL_COMMUNITY): Payer: Self-pay

## 2023-05-05 MED ORDER — LISDEXAMFETAMINE DIMESYLATE 60 MG PO CAPS
60.0000 mg | ORAL_CAPSULE | Freq: Every morning | ORAL | 0 refills | Status: DC
Start: 2023-05-05 — End: 2023-06-03
  Filled 2023-05-05 – 2023-05-07 (×2): qty 30, 30d supply, fill #0

## 2023-05-07 ENCOUNTER — Other Ambulatory Visit (HOSPITAL_COMMUNITY): Payer: Self-pay

## 2023-05-07 DIAGNOSIS — S46811D Strain of other muscles, fascia and tendons at shoulder and upper arm level, right arm, subsequent encounter: Secondary | ICD-10-CM | POA: Diagnosis not present

## 2023-05-11 DIAGNOSIS — S46811D Strain of other muscles, fascia and tendons at shoulder and upper arm level, right arm, subsequent encounter: Secondary | ICD-10-CM | POA: Diagnosis not present

## 2023-05-19 DIAGNOSIS — S46811D Strain of other muscles, fascia and tendons at shoulder and upper arm level, right arm, subsequent encounter: Secondary | ICD-10-CM | POA: Diagnosis not present

## 2023-05-26 DIAGNOSIS — S46811D Strain of other muscles, fascia and tendons at shoulder and upper arm level, right arm, subsequent encounter: Secondary | ICD-10-CM | POA: Diagnosis not present

## 2023-06-03 ENCOUNTER — Other Ambulatory Visit (HOSPITAL_COMMUNITY): Payer: Self-pay

## 2023-06-03 MED ORDER — LISDEXAMFETAMINE DIMESYLATE 60 MG PO CAPS
60.0000 mg | ORAL_CAPSULE | Freq: Every morning | ORAL | 0 refills | Status: DC
Start: 2023-06-03 — End: 2023-07-06
  Filled 2023-06-03 – 2023-06-05 (×2): qty 30, 30d supply, fill #0

## 2023-06-05 ENCOUNTER — Other Ambulatory Visit (HOSPITAL_COMMUNITY): Payer: Self-pay

## 2023-06-17 DIAGNOSIS — Z1389 Encounter for screening for other disorder: Secondary | ICD-10-CM | POA: Diagnosis not present

## 2023-06-17 DIAGNOSIS — Z0001 Encounter for general adult medical examination with abnormal findings: Secondary | ICD-10-CM | POA: Diagnosis not present

## 2023-06-19 DIAGNOSIS — S46811D Strain of other muscles, fascia and tendons at shoulder and upper arm level, right arm, subsequent encounter: Secondary | ICD-10-CM | POA: Diagnosis not present

## 2023-06-19 DIAGNOSIS — R82998 Other abnormal findings in urine: Secondary | ICD-10-CM | POA: Diagnosis not present

## 2023-06-24 DIAGNOSIS — Z0000 Encounter for general adult medical examination without abnormal findings: Secondary | ICD-10-CM | POA: Diagnosis not present

## 2023-06-24 DIAGNOSIS — Z1339 Encounter for screening examination for other mental health and behavioral disorders: Secondary | ICD-10-CM | POA: Diagnosis not present

## 2023-06-24 DIAGNOSIS — R7301 Impaired fasting glucose: Secondary | ICD-10-CM | POA: Diagnosis not present

## 2023-06-24 DIAGNOSIS — Z1331 Encounter for screening for depression: Secondary | ICD-10-CM | POA: Diagnosis not present

## 2023-07-06 ENCOUNTER — Other Ambulatory Visit (HOSPITAL_COMMUNITY): Payer: Self-pay

## 2023-07-06 MED ORDER — LISDEXAMFETAMINE DIMESYLATE 60 MG PO CAPS
60.0000 mg | ORAL_CAPSULE | ORAL | 0 refills | Status: DC
Start: 2023-07-06 — End: 2023-08-05
  Filled 2023-07-06: qty 30, 30d supply, fill #0

## 2023-08-05 ENCOUNTER — Other Ambulatory Visit (HOSPITAL_COMMUNITY): Payer: Self-pay

## 2023-08-05 MED ORDER — LISDEXAMFETAMINE DIMESYLATE 60 MG PO CAPS
60.0000 mg | ORAL_CAPSULE | Freq: Every morning | ORAL | 0 refills | Status: DC
Start: 2023-08-05 — End: 2023-09-02
  Filled 2023-08-05: qty 30, 30d supply, fill #0

## 2023-08-06 ENCOUNTER — Other Ambulatory Visit (HOSPITAL_COMMUNITY): Payer: Self-pay

## 2023-09-02 ENCOUNTER — Other Ambulatory Visit: Payer: Self-pay

## 2023-09-02 ENCOUNTER — Other Ambulatory Visit (HOSPITAL_COMMUNITY): Payer: Self-pay

## 2023-09-02 MED ORDER — LISDEXAMFETAMINE DIMESYLATE 60 MG PO CAPS
60.0000 mg | ORAL_CAPSULE | Freq: Every morning | ORAL | 0 refills | Status: DC
Start: 2023-09-02 — End: 2023-10-01
  Filled 2023-09-02: qty 30, 30d supply, fill #0

## 2023-09-16 DIAGNOSIS — H16223 Keratoconjunctivitis sicca, not specified as Sjogren's, bilateral: Secondary | ICD-10-CM | POA: Diagnosis not present

## 2023-09-23 DIAGNOSIS — L820 Inflamed seborrheic keratosis: Secondary | ICD-10-CM | POA: Diagnosis not present

## 2023-09-23 DIAGNOSIS — L578 Other skin changes due to chronic exposure to nonionizing radiation: Secondary | ICD-10-CM | POA: Diagnosis not present

## 2023-09-23 DIAGNOSIS — L821 Other seborrheic keratosis: Secondary | ICD-10-CM | POA: Diagnosis not present

## 2023-09-29 DIAGNOSIS — Z1231 Encounter for screening mammogram for malignant neoplasm of breast: Secondary | ICD-10-CM | POA: Diagnosis not present

## 2023-09-29 DIAGNOSIS — Z1331 Encounter for screening for depression: Secondary | ICD-10-CM | POA: Diagnosis not present

## 2023-09-29 DIAGNOSIS — Z124 Encounter for screening for malignant neoplasm of cervix: Secondary | ICD-10-CM | POA: Diagnosis not present

## 2023-09-29 DIAGNOSIS — Z01419 Encounter for gynecological examination (general) (routine) without abnormal findings: Secondary | ICD-10-CM | POA: Diagnosis not present

## 2023-09-29 DIAGNOSIS — Z01411 Encounter for gynecological examination (general) (routine) with abnormal findings: Secondary | ICD-10-CM | POA: Diagnosis not present

## 2023-10-01 ENCOUNTER — Other Ambulatory Visit (HOSPITAL_COMMUNITY): Payer: Self-pay

## 2023-10-01 MED ORDER — LISDEXAMFETAMINE DIMESYLATE 60 MG PO CAPS
60.0000 mg | ORAL_CAPSULE | Freq: Every morning | ORAL | 0 refills | Status: DC
  Filled 2023-10-01: qty 30, 30d supply, fill #0

## 2023-10-05 ENCOUNTER — Other Ambulatory Visit (HOSPITAL_COMMUNITY): Payer: Self-pay

## 2023-10-14 DIAGNOSIS — L308 Other specified dermatitis: Secondary | ICD-10-CM | POA: Diagnosis not present

## 2023-10-21 DIAGNOSIS — N841 Polyp of cervix uteri: Secondary | ICD-10-CM | POA: Diagnosis not present

## 2023-10-21 DIAGNOSIS — N870 Mild cervical dysplasia: Secondary | ICD-10-CM | POA: Diagnosis not present

## 2023-10-21 DIAGNOSIS — R87610 Atypical squamous cells of undetermined significance on cytologic smear of cervix (ASC-US): Secondary | ICD-10-CM | POA: Diagnosis not present

## 2023-11-02 ENCOUNTER — Other Ambulatory Visit (HOSPITAL_COMMUNITY): Payer: Self-pay

## 2023-11-02 MED ORDER — LISDEXAMFETAMINE DIMESYLATE 60 MG PO CAPS
60.0000 mg | ORAL_CAPSULE | Freq: Every morning | ORAL | 0 refills | Status: DC
Start: 2023-11-02 — End: 2023-12-03
  Filled 2023-11-02: qty 30, 30d supply, fill #0

## 2023-11-19 DIAGNOSIS — M25511 Pain in right shoulder: Secondary | ICD-10-CM | POA: Diagnosis not present

## 2023-12-03 ENCOUNTER — Other Ambulatory Visit (HOSPITAL_COMMUNITY): Payer: Self-pay

## 2023-12-03 ENCOUNTER — Other Ambulatory Visit: Payer: Self-pay

## 2023-12-03 MED ORDER — LISDEXAMFETAMINE DIMESYLATE 60 MG PO CAPS
60.0000 mg | ORAL_CAPSULE | Freq: Every morning | ORAL | 0 refills | Status: AC
Start: 2023-12-03 — End: ?
  Filled 2023-12-03: qty 30, 30d supply, fill #0

## 2023-12-30 ENCOUNTER — Other Ambulatory Visit: Payer: Self-pay | Admitting: Orthopaedic Surgery

## 2023-12-30 DIAGNOSIS — G8929 Other chronic pain: Secondary | ICD-10-CM

## 2023-12-31 ENCOUNTER — Other Ambulatory Visit (HOSPITAL_COMMUNITY): Payer: Self-pay

## 2023-12-31 ENCOUNTER — Ambulatory Visit
Admission: RE | Admit: 2023-12-31 | Discharge: 2023-12-31 | Disposition: A | Discharge status: Home / Self Care | Source: Ambulatory Visit | Attending: Orthopaedic Surgery | Admitting: Orthopaedic Surgery

## 2023-12-31 ENCOUNTER — Other Ambulatory Visit

## 2023-12-31 DIAGNOSIS — M25511 Pain in right shoulder: Secondary | ICD-10-CM | POA: Diagnosis not present

## 2023-12-31 DIAGNOSIS — G8929 Other chronic pain: Secondary | ICD-10-CM | POA: Diagnosis not present

## 2023-12-31 DIAGNOSIS — M19011 Primary osteoarthritis, right shoulder: Secondary | ICD-10-CM | POA: Diagnosis not present

## 2023-12-31 MED ORDER — LISDEXAMFETAMINE DIMESYLATE 60 MG PO CAPS
60.0000 mg | ORAL_CAPSULE | Freq: Every morning | ORAL | 0 refills | Status: DC
Start: 2024-01-01 — End: 2024-03-02
  Filled 2024-01-01: qty 30, 30d supply, fill #0

## 2024-01-01 ENCOUNTER — Other Ambulatory Visit: Payer: Self-pay

## 2024-01-01 ENCOUNTER — Other Ambulatory Visit (HOSPITAL_COMMUNITY): Payer: Self-pay

## 2024-02-11 DIAGNOSIS — M25511 Pain in right shoulder: Secondary | ICD-10-CM | POA: Diagnosis not present

## 2024-02-11 DIAGNOSIS — M19011 Primary osteoarthritis, right shoulder: Secondary | ICD-10-CM | POA: Diagnosis not present

## 2024-02-15 DIAGNOSIS — M47812 Spondylosis without myelopathy or radiculopathy, cervical region: Secondary | ICD-10-CM | POA: Diagnosis not present

## 2024-03-02 ENCOUNTER — Other Ambulatory Visit (HOSPITAL_COMMUNITY): Payer: Self-pay

## 2024-03-02 MED ORDER — LISDEXAMFETAMINE DIMESYLATE 60 MG PO CAPS
60.0000 mg | ORAL_CAPSULE | Freq: Every morning | ORAL | 0 refills | Status: AC
  Filled 2024-03-02: qty 30, 30d supply, fill #0

## 2024-03-23 DIAGNOSIS — N952 Postmenopausal atrophic vaginitis: Secondary | ICD-10-CM | POA: Diagnosis not present

## 2024-03-23 DIAGNOSIS — F520 Hypoactive sexual desire disorder: Secondary | ICD-10-CM | POA: Diagnosis not present

## 2024-03-23 DIAGNOSIS — N951 Menopausal and female climacteric states: Secondary | ICD-10-CM | POA: Diagnosis not present

## 2024-03-30 DIAGNOSIS — M19011 Primary osteoarthritis, right shoulder: Secondary | ICD-10-CM | POA: Diagnosis not present

## 2024-04-04 ENCOUNTER — Other Ambulatory Visit (HOSPITAL_COMMUNITY): Payer: Self-pay

## 2024-04-04 MED ORDER — LISDEXAMFETAMINE DIMESYLATE 50 MG PO CAPS
50.0000 mg | ORAL_CAPSULE | Freq: Every morning | ORAL | 0 refills | Status: DC
  Filled 2024-04-04: qty 30, 30d supply, fill #0

## 2024-04-05 ENCOUNTER — Other Ambulatory Visit (HOSPITAL_COMMUNITY): Payer: Self-pay

## 2024-05-03 ENCOUNTER — Other Ambulatory Visit (HOSPITAL_COMMUNITY): Payer: Self-pay

## 2024-05-03 MED ORDER — LISDEXAMFETAMINE DIMESYLATE 50 MG PO CAPS
50.0000 mg | ORAL_CAPSULE | Freq: Every morning | ORAL | 0 refills | Status: DC
  Filled 2024-05-03: qty 30, 30d supply, fill #0

## 2024-05-06 ENCOUNTER — Other Ambulatory Visit (HOSPITAL_COMMUNITY): Payer: Self-pay

## 2024-05-23 ENCOUNTER — Other Ambulatory Visit (HOSPITAL_COMMUNITY): Payer: Self-pay

## 2024-05-23 MED ORDER — LISDEXAMFETAMINE DIMESYLATE 50 MG PO CAPS
50.0000 mg | ORAL_CAPSULE | Freq: Every morning | ORAL | 0 refills | Status: DC
  Filled 2024-06-03: qty 30, 30d supply, fill #0

## 2024-05-24 ENCOUNTER — Other Ambulatory Visit (HOSPITAL_COMMUNITY): Payer: Self-pay

## 2024-06-03 ENCOUNTER — Other Ambulatory Visit (HOSPITAL_COMMUNITY): Payer: Self-pay

## 2024-06-24 DIAGNOSIS — Z96611 Presence of right artificial shoulder joint: Secondary | ICD-10-CM | POA: Diagnosis not present

## 2024-06-29 DIAGNOSIS — Z96611 Presence of right artificial shoulder joint: Secondary | ICD-10-CM | POA: Diagnosis not present

## 2024-06-29 DIAGNOSIS — M19011 Primary osteoarthritis, right shoulder: Secondary | ICD-10-CM | POA: Diagnosis not present

## 2024-07-01 ENCOUNTER — Other Ambulatory Visit (HOSPITAL_COMMUNITY): Payer: Self-pay

## 2024-07-01 MED ORDER — LISDEXAMFETAMINE DIMESYLATE 50 MG PO CAPS
50.0000 mg | ORAL_CAPSULE | Freq: Every morning | ORAL | 0 refills | Status: DC
  Filled 2024-07-04 (×2): qty 30, 30d supply, fill #0

## 2024-07-04 ENCOUNTER — Other Ambulatory Visit (HOSPITAL_COMMUNITY): Payer: Self-pay

## 2024-07-05 DIAGNOSIS — F520 Hypoactive sexual desire disorder: Secondary | ICD-10-CM | POA: Diagnosis not present

## 2024-07-05 DIAGNOSIS — N951 Menopausal and female climacteric states: Secondary | ICD-10-CM | POA: Diagnosis not present

## 2024-07-06 DIAGNOSIS — Z96611 Presence of right artificial shoulder joint: Secondary | ICD-10-CM | POA: Diagnosis not present

## 2024-07-06 DIAGNOSIS — M19011 Primary osteoarthritis, right shoulder: Secondary | ICD-10-CM | POA: Diagnosis not present

## 2024-07-14 DIAGNOSIS — M19011 Primary osteoarthritis, right shoulder: Secondary | ICD-10-CM | POA: Diagnosis not present

## 2024-07-14 DIAGNOSIS — Z96611 Presence of right artificial shoulder joint: Secondary | ICD-10-CM | POA: Diagnosis not present

## 2024-07-25 DIAGNOSIS — R82998 Other abnormal findings in urine: Secondary | ICD-10-CM | POA: Diagnosis not present

## 2024-07-26 DIAGNOSIS — M79671 Pain in right foot: Secondary | ICD-10-CM | POA: Diagnosis not present

## 2024-07-26 DIAGNOSIS — M722 Plantar fascial fibromatosis: Secondary | ICD-10-CM | POA: Diagnosis not present

## 2024-07-26 DIAGNOSIS — M79672 Pain in left foot: Secondary | ICD-10-CM | POA: Diagnosis not present

## 2024-07-27 DIAGNOSIS — Z96611 Presence of right artificial shoulder joint: Secondary | ICD-10-CM | POA: Diagnosis not present

## 2024-07-27 DIAGNOSIS — M19011 Primary osteoarthritis, right shoulder: Secondary | ICD-10-CM | POA: Diagnosis not present

## 2024-08-03 ENCOUNTER — Other Ambulatory Visit (HOSPITAL_COMMUNITY): Payer: Self-pay

## 2024-08-03 MED ORDER — LISDEXAMFETAMINE DIMESYLATE 50 MG PO CAPS
50.0000 mg | ORAL_CAPSULE | ORAL | 0 refills | Status: DC
  Filled 2024-08-03: qty 30, 30d supply, fill #0

## 2024-08-08 DIAGNOSIS — M19011 Primary osteoarthritis, right shoulder: Secondary | ICD-10-CM | POA: Diagnosis not present

## 2024-08-08 DIAGNOSIS — Z96611 Presence of right artificial shoulder joint: Secondary | ICD-10-CM | POA: Diagnosis not present

## 2024-08-31 ENCOUNTER — Other Ambulatory Visit (HOSPITAL_COMMUNITY): Payer: Self-pay

## 2024-08-31 MED ORDER — LISDEXAMFETAMINE DIMESYLATE 50 MG PO CAPS
50.0000 mg | ORAL_CAPSULE | Freq: Every morning | ORAL | 0 refills | Status: DC
  Filled 2024-08-31 – 2024-09-01 (×2): qty 30, 30d supply, fill #0

## 2024-09-01 ENCOUNTER — Other Ambulatory Visit (HOSPITAL_COMMUNITY): Payer: Self-pay

## 2024-09-07 ENCOUNTER — Other Ambulatory Visit (HOSPITAL_COMMUNITY): Payer: Self-pay

## 2024-09-07 MED ORDER — PLENVU 140 G PO SOLR
ORAL | 0 refills | Status: AC
  Filled 2024-09-07 – 2024-09-27 (×2): qty 3, 1d supply, fill #0

## 2024-09-07 MED ORDER — BISACODYL 5 MG PO TBEC
20.0000 mg | DELAYED_RELEASE_TABLET | ORAL | 0 refills | Status: AC
  Filled 2024-09-07 – 2024-09-27 (×2): qty 4, 1d supply, fill #0

## 2024-09-12 ENCOUNTER — Ambulatory Visit: Admitting: Family Medicine

## 2024-09-12 VITALS — BP 153/75 | Ht 64.0 in | Wt 150.0 lb

## 2024-09-12 DIAGNOSIS — G5762 Lesion of plantar nerve, left lower limb: Secondary | ICD-10-CM | POA: Diagnosis not present

## 2024-09-12 DIAGNOSIS — M722 Plantar fascial fibromatosis: Secondary | ICD-10-CM

## 2024-09-12 NOTE — Progress Notes (Signed)
 "  PCP: Molly Charleston, MD  Patient is a 59 y.o. female here for orthotics.  HPI Patient has had plantar fasciitis for years.  Previously seen by Dr. Con Hamel for plantar fasciitis bilaterally and Morton Neuroma (L).  She has tried injections into both feet for plantar fasciitis about a week ago.  Prior to this she has tried shockwave therapy (4 treatments in total) without much relief or benefit.  She has never tried custom orthotics but has tried Dr. Heriberto inserts in the past which have been painful for her.  She reports that wearing [employer]ko's and PT helped relieve her symptoms since she was previously limping and now can walk without too much pain.  She experiences some soreness at rest but her symptoms are aggravated by weightbearing.  No past medical history on file.  Medications Ordered Prior to Encounter[1]  Past Surgical History:  Procedure Laterality Date   BREAST EXCISIONAL BIOPSY Left 2009    Allergies[2]  BP (!) 153/75   Ht 5' 4 (1.626 m)   Wt 150 lb (68 kg)   LMP 03/07/2013   BMI 25.75 kg/m       No data to display              No data to display              Objective:  Physical Exam:  Foot exam Gen: NAD, comfortable in exam room Inspection: No visible lesions, edema, erythema, overlying skin changes.  Mild collapse of the transverse arch but normal longitudinal arches. Rotation bilaterally of the 3rd, 4th, and 5th toes. Callus noted bilaterally under the 2nd MT heads.  Palpation: TTP over plantar fascia bilaterally. ROM: FROM with dorsiflexion, plantarflexion, IR, and ER Special Tests: - Thompson's test bilaterally Strength: 5/5 with resisted dorsiflexion and plantarflexion NVI distally Leg lengths - slight shortening of left by a few mm  Assessment and Plan:   Assessment & Plan Plantar fasciitis, bilateral Morton neuroma of left foot Patient presents with long history of plantar fasciitis and Morton neuroma of L foot s/p bilateral  injections and shockwave therapy without significant improvement.  Patient would like to get custom orthotics fitted today.  Advised that she Mancinelli see benefit within the next few weeks.  Patient was fitted for a : standard, cushioned, semi-rigid orthotic. The orthotic was heated and afterward the patient stood on the orthotic blank positioned on the orthotic stand. The patient was positioned in subtalar neutral position and 10 degrees of ankle dorsiflexion in a weight bearing stance. After completion of molding, a stable base was applied to the orthotic blank. The blank was ground to a stable position for weight bearing. Size: 9 fit & run Base: none Posting: none Additional orthotic padding: medium metatarsal pads   Kathrine Melena, DO [facility]     [1]  Current Outpatient Medications on File Prior to Visit  Medication Sig Dispense Refill   bisacodyl  (DULCOLAX) 5 MG EC tablet Take 4 tablets (20 mg total) by mouth as directed 4 tablet 0   lisdexamfetamine  (VYVANSE ) 50 MG capsule Take 1 capsule (50 mg total) by mouth in the morning. 30 capsule 0   lisdexamfetamine  (VYVANSE ) 50 MG capsule Take 1 capsule (50 mg total) by mouth every morning. 30 capsule 0   lisdexamfetamine  (VYVANSE ) 60 MG capsule Take 1 capsule (60 mg total) by mouth in the morning. 30 capsule 0   lisdexamfetamine  (VYVANSE ) 60 MG capsule Take 1 capsule (60 mg total) by mouth in  the morning. 30 capsule 0   PEG-KCl-NaCl-NaSulf-Na Asc-C (PLENVU ) 140 g SOLR Use as directed 3 each 0   No current facility-administered medications on file prior to visit.  [2] No Known Allergies  "

## 2024-09-20 ENCOUNTER — Other Ambulatory Visit (HOSPITAL_COMMUNITY): Payer: Self-pay

## 2024-09-27 ENCOUNTER — Other Ambulatory Visit (HOSPITAL_COMMUNITY): Payer: Self-pay

## 2024-09-27 ENCOUNTER — Other Ambulatory Visit: Payer: Self-pay

## 2024-09-30 ENCOUNTER — Other Ambulatory Visit (HOSPITAL_COMMUNITY): Payer: Self-pay

## 2024-09-30 MED ORDER — LISDEXAMFETAMINE DIMESYLATE 50 MG PO CAPS
50.0000 mg | ORAL_CAPSULE | Freq: Every morning | ORAL | 0 refills | Status: AC
  Filled 2024-09-30: qty 30, 30d supply, fill #0
